# Patient Record
Sex: Female | Born: 1980 | Race: Black or African American | Hispanic: No | Smoking: Never smoker
Health system: Southern US, Community
[De-identification: ages and names within clinical notes are randomized; demographics above are authoritative.]

## PROBLEM LIST (undated history)

## (undated) DIAGNOSIS — N643 Galactorrhea not associated with childbirth: Secondary | ICD-10-CM

## (undated) DIAGNOSIS — E669 Obesity, unspecified: Secondary | ICD-10-CM

## (undated) DIAGNOSIS — E229 Hyperfunction of pituitary gland, unspecified: Secondary | ICD-10-CM

## (undated) DIAGNOSIS — D352 Benign neoplasm of pituitary gland: Secondary | ICD-10-CM

## (undated) HISTORY — DX: Obesity, unspecified: E66.9

## (undated) HISTORY — DX: Galactorrhea not associated with childbirth: N64.3

## (undated) HISTORY — DX: Benign neoplasm of pituitary gland: D35.2

## (undated) HISTORY — DX: Hyperfunction of pituitary gland, unspecified: E22.9

---

## 2014-06-14 DIAGNOSIS — E559 Vitamin D deficiency, unspecified: Secondary | ICD-10-CM | POA: Insufficient documentation

## 2015-04-21 DIAGNOSIS — D352 Benign neoplasm of pituitary gland: Secondary | ICD-10-CM

## 2015-04-21 HISTORY — DX: Benign neoplasm of pituitary gland: D35.2

## 2015-09-05 ENCOUNTER — Other Ambulatory Visit: Payer: Self-pay | Admitting: Nurse Practitioner

## 2015-10-15 DIAGNOSIS — E229 Hyperfunction of pituitary gland, unspecified: Secondary | ICD-10-CM

## 2015-10-15 DIAGNOSIS — D352 Benign neoplasm of pituitary gland: Secondary | ICD-10-CM | POA: Insufficient documentation

## 2017-01-21 LAB — HEPATIC FUNCTION PANEL
ALT: 17
AST: 19
Alkaline Phosphatase: 103 (ref 25–125)
Bilirubin, Total: 0.4

## 2017-01-21 LAB — TSH: TSH: 1.14 (ref 0.41–5.90)

## 2017-01-21 LAB — HEMOGLOBIN A1C: Hemoglobin A1C: 5.3

## 2017-01-21 LAB — LIPID PANEL
Cholesterol: 170 (ref 0–200)
HDL: 45 (ref 35–70)
LDL CALC: 114
LDl/HDL Ratio: 2.5
Triglycerides: 57 (ref 40–160)

## 2017-01-21 LAB — CBC AND DIFFERENTIAL
HCT: 40 (ref 39–52)
Hemoglobin: 13.3 (ref 13.0–17.0)
Neutrophils Absolute: 3
PLATELETS: 381 (ref 150–399)
WBC: 7.1

## 2017-01-21 LAB — VITAMIN D 25 HYDROXY (VIT D DEFICIENCY, FRACTURES): Vit D, 25-Hydroxy: 24.7

## 2017-01-21 LAB — BASIC METABOLIC PANEL
BUN: 7 (ref 4–21)
Creatinine: 0.9
GLUCOSE: 83
Potassium: 4.1 (ref 3.4–5.3)
SODIUM: 138 (ref 137–147)

## 2018-01-08 ENCOUNTER — Encounter: Payer: Self-pay | Admitting: Nurse Practitioner

## 2018-01-08 DIAGNOSIS — J309 Allergic rhinitis, unspecified: Secondary | ICD-10-CM | POA: Insufficient documentation

## 2018-01-08 DIAGNOSIS — R05 Cough: Secondary | ICD-10-CM | POA: Insufficient documentation

## 2018-01-08 DIAGNOSIS — R059 Cough, unspecified: Secondary | ICD-10-CM | POA: Insufficient documentation

## 2018-01-28 ENCOUNTER — Encounter: Payer: Self-pay | Admitting: Nurse Practitioner

## 2018-03-30 ENCOUNTER — Encounter: Payer: Self-pay | Admitting: Nurse Practitioner

## 2018-05-31 ENCOUNTER — Encounter: Payer: Self-pay | Admitting: Internal Medicine

## 2018-06-01 ENCOUNTER — Ambulatory Visit (INDEPENDENT_AMBULATORY_CARE_PROVIDER_SITE_OTHER): Admitting: Internal Medicine

## 2018-06-01 ENCOUNTER — Other Ambulatory Visit: Payer: Self-pay

## 2018-06-01 ENCOUNTER — Encounter: Payer: Self-pay | Admitting: Internal Medicine

## 2018-06-01 VITALS — BP 128/74 | HR 68 | Temp 98.8°F | Ht 62.8 in | Wt 230.2 lb

## 2018-06-01 DIAGNOSIS — Z0000 Encounter for general adult medical examination without abnormal findings: Secondary | ICD-10-CM | POA: Diagnosis not present

## 2018-06-01 DIAGNOSIS — D497 Neoplasm of unspecified behavior of endocrine glands and other parts of nervous system: Secondary | ICD-10-CM

## 2018-06-01 DIAGNOSIS — E661 Drug-induced obesity: Secondary | ICD-10-CM | POA: Diagnosis not present

## 2018-06-01 DIAGNOSIS — Z308 Encounter for other contraceptive management: Secondary | ICD-10-CM | POA: Diagnosis not present

## 2018-06-01 DIAGNOSIS — R635 Abnormal weight gain: Secondary | ICD-10-CM | POA: Diagnosis not present

## 2018-06-01 DIAGNOSIS — Z3041 Encounter for surveillance of contraceptive pills: Secondary | ICD-10-CM | POA: Diagnosis not present

## 2018-06-01 MED ORDER — NORGESTREL-ETHINYL ESTRADIOL 0.3-30 MG-MCG PO TABS: 1.0000 | ORAL_TABLET | Freq: Every day | ORAL | 3 refills | Status: DC

## 2018-06-01 MED ORDER — IBUPROFEN 800 MG PO TABS: 800.0000 mg | ORAL_TABLET | Freq: Three times a day (TID) | ORAL | 0 refills | Status: AC | PRN

## 2018-06-01 NOTE — Patient Instructions (Signed)
Please bring your immunization records so we can update them in your chart.   Preventive Care 18-39 Years, Female Preventive care refers to lifestyle choices and visits with your health care provider that can promote health and wellness. What does preventive care include?   A yearly physical exam. This is also called an annual well check.  Dental exams once or twice a year.  Routine eye exams. Ask your health care provider how often you should have your eyes checked.  Personal lifestyle choices, including: ? Daily care of your teeth and gums. ? Regular physical activity. ? Eating a healthy diet. ? Avoiding tobacco and drug use. ? Limiting alcohol use. ? Practicing safe sex. ? Taking vitamin and mineral supplements as recommended by your health care provider. What happens during an annual well check? The services and screenings done by your health care provider during your annual well check will depend on your age, overall health, lifestyle risk factors, and family history of disease. Counseling Your health care provider may ask you questions about your:  Alcohol use.  Tobacco use.  Drug use.  Emotional well-being.  Home and relationship well-being.  Sexual activity.  Eating habits.  Work and work Statistician.  Method of birth control.  Menstrual cycle.  Pregnancy history. Screening You may have the following tests or measurements:  Height, weight, and BMI.  Diabetes screening. This is done by checking your blood sugar (glucose) after you have not eaten for a while (fasting).  Blood pressure.  Lipid and cholesterol levels. These may be checked every 5 years starting at age 3.  Skin check.  Hepatitis C blood test.  Hepatitis B blood test.  Sexually transmitted disease (STD) testing.  BRCA-related cancer screening. This may be done if you have a family history of breast, ovarian, tubal, or peritoneal cancers.  Pelvic exam and Pap test. This may be  done every 3 years starting at age 94. Starting at age 18, this may be done every 5 years if you have a Pap test in combination with an HPV test. Discuss your test results, treatment options, and if necessary, the need for more tests with your health care provider. Vaccines Your health care provider may recommend certain vaccines, such as:  Influenza vaccine. This is recommended every year.  Tetanus, diphtheria, and acellular pertussis (Tdap, Td) vaccine. You may need a Td booster every 10 years.  Varicella vaccine. You may need this if you have not been vaccinated.  HPV vaccine. If you are 54 or younger, you may need three doses over 6 months.  Measles, mumps, and rubella (MMR) vaccine. You may need at least one dose of MMR. You may also need a second dose.  Pneumococcal 13-valent conjugate (PCV13) vaccine. You may need this if you have certain conditions and were not previously vaccinated.  Pneumococcal polysaccharide (PPSV23) vaccine. You may need one or two doses if you smoke cigarettes or if you have certain conditions.  Meningococcal vaccine. One dose is recommended if you are age 62-21 years and a first-year college student living in a residence hall, or if you have one of several medical conditions. You may also need additional booster doses.  Hepatitis A vaccine. You may need this if you have certain conditions or if you travel or work in places where you may be exposed to hepatitis A.  Hepatitis B vaccine. You may need this if you have certain conditions or if you travel or work in places where you may be exposed  to hepatitis B.  Haemophilus influenzae type b (Hib) vaccine. You may need this if you have certain risk factors. Talk to your health care provider about which screenings and vaccines you need and how often you need them. This information is not intended to replace advice given to you by your health care provider. Make sure you discuss any questions you have with your  health care provider. Document Released: 06/02/2001 Document Revised: 11/17/2016 Document Reviewed: 02/05/2015 Elsevier Interactive Patient Education  2019 [employer].

## 2018-06-01 NOTE — Progress Notes (Signed)
* Subjective:     Patient ID: Elizabeth Graham , adult    DOB: 14-Feb-1981 , 38 y.o.   MRN: [id number]   Chief Complaint  Patient presents with  . Annual Exam  . Referral    DR PATEL  - TUMOR    HPI  Pt is here for yearly physical. She also needs referral to Dr Posey Pronto for this year. Her last prolactin level last month was 12. She needs her Ibuprofen and birth control  Refilled. 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 She does admit she snacks the 7h she is in class. Her last pap was normal in 2018.   Past Medical History:  Diagnosis Date  . Obesity   . 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 Polygalactia         Current Outpatient Medications:  .  cabergoline (DOSTINEX) 0.5 MG tablet, Take 0.5 mg by mouth once a week., Disp: , Rfl:  .  cetirizine (ZYRTEC) 10 MG tablet, Take 10 mg by mouth daily., Disp: , Rfl:  .  norgestrel-ethinyl estradiol (LO/OVRAL,CRYSELLE) 0.3-30 MG-MCG tablet, Take 1 tablet by mouth daily., Disp: 3 Package, Rfl: 3 .  fluticasone (FLONASE) 50 MCG/ACT nasal spray, Place 2 sprays into both nostrils daily., Disp: , Rfl:  .  ibuprofen (ADVIL,MOTRIN) 800 MG tablet, Take 1 tablet (800 mg total) by mouth every 8 (eight) hours as needed for headache., Disp: 90 tablet, Rfl: 0  Family History  Problem Relation Age of Onset  . Diabetes Mother   . Hypertension Mother   . Heart disease Mother       Review of Systems  Constitutional: Positive for unexpected weight change. Negative for chills, diaphoresis and fatigue.       10 lb wt gain since last month.   HENT: Negative.   Eyes: Negative.   Respiratory: Negative for apnea, cough and shortness of breath.        Denies snoring  Cardiovascular: Positive for leg swelling. Negative for chest pain and palpitations.       Sometimes notices that her ankles get puffy.   Gastrointestinal: Negative.   Endocrine: Positive  for cold intolerance. Negative for heat intolerance, polydipsia and polyphagia.  Genitourinary: Positive for urgency. Negative for dysuria and frequency.  Musculoskeletal: Negative.   Skin: Negative.   Allergic/Immunologic: Negative for environmental allergies and food allergies.  Neurological: Positive for headaches. Negative for dizziness, tremors, seizures, syncope, weakness and numbness.       Chronic and Ibuprofen helps. Some weeks gets them more often and other weeks does not get them. Sometimes wakes up with them. No HA last month.   Hematological: Negative for adenopathy. Does not bruise/bleed easily.  Psychiatric/Behavioral: Negative for sleep disturbance. The patient is not nervous/anxious.        Denies depression     Today's Vitals   06/01/18 1147  BP: 128/74  Pulse: 68  Temp: 98.8 F (37.1 C)  TempSrc: Oral  SpO2: 98%  Weight: 230 lb 3.2 oz (104.4 kg)  Height: 5' 2.8" (1.595 m)   Body mass index is 41.04 kg/m.    Objective:  Physical Exam  BP 128/74 (BP Location: Left Arm, Patient Position: Sitting, Cuff Size: Normal)   Pulse 68   Temp 98.8 F (37.1 C) (Oral)   Ht 5' 2.8" (1.595 m)   Wt 230 lb 3.2 oz (104.4 kg)   LMP  04/23/2018 (Approximate)   SpO2 98%   BMI 41.04 kg/m   General Appearance:    Alert, obese, cooperative, no distress, appears stated age  Head:    Normocephalic, without obvious abnormality, atraumatic  Eyes:    PERRL, conjunctiva/corneas clear, EOM's intact, fundi    benign, both eyes  Ears:    Normal TM's and external ear canals, both ears  Nose:   Nares normal, septum midline, mucosa normal, no drainage    or sinus tenderness  Throat:   Lips, mucosa, and tongue normal; teeth and gums normal  Neck:   Supple, symmetrical, trachea midline, no adenopathy;    thyroid:  no enlargement/tenderness/nodules.  Back:     Symmetric, no curvature, ROM normal, no CVA tenderness  Lungs:     Clear to auscultation bilaterally, respirations unlabored   Chest Wall:    No tenderness or deformity   Heart:    Regular rate and rhythm, S1 and S2 normal, no murmur, rub   or gallop  Breast Exam:    No tenderness, masses, or nipple abnormality  Abdomen:     Soft, non-tender, bowel sounds active all four quadrants,    no masses, no organomegaly        Extremities:   Extremities normal, atraumatic, no cyanosis or edema  Pulses:   2+ and symmetric all extremities  Skin:   Skin color, texture, turgor normal, no rashes or lesions  Lymph nodes:   Cervical, supraclavicular, and axillary nodes normal  Neurologic:   CNII-XII intact, normal strength, sensation and reflexes    Throughout. Romberg, tandem gait, tip toe and heel gait are and finger to nose are normal.      Assessment And Plan:    1. Routine general medical examination at a health care facility-routine. FU 1y - POCT Urinalysis Dipstick (81002) - CMP14 + Anion Gap - Lipid Profile - CBC no Diff  2. Pituitary tumor- chronic. Referral done.  - Ambulatory referral to Endocrinology  3. Weight gain- new - TSH - T3, free - T4, Free  4. Class 3 severe obesity due to excess calories without serious comorbidity in adult, unspecified BMI ([place])- chronic. She was offered help to loose wt and she declined. I reviewed with her the health complications due to obesity.   5. Encounter for other contraceptive management- I refilled her birth control. FU 1y.       Waver Dibiasio RODRIGUEZ-SOUTHWORTH, PA-C

## 2018-06-02 LAB — POCT URINALYSIS DIPSTICK
BILIRUBIN UA: NEGATIVE
GLUCOSE UA: NEGATIVE
KETONES UA: NEGATIVE
Nitrite, UA: NEGATIVE
Protein, UA: NEGATIVE
UROBILINOGEN UA: 0.2 U/dL
pH, UA: 5.5 (ref 5.0–8.0)

## 2018-06-02 LAB — T4, FREE: FREE T4: 1.23 ng/dL

## 2018-06-02 LAB — CBC
HEMOGLOBIN: 12.6 g/dL
Hematocrit: 38.5 %
MCH: 29.4 pg
MCHC: 32.7 g/dL
MCV: 90 fL
Platelets: 334 10*3/uL (ref 150–450)
RBC: 4.28 x10E6/uL
RDW: 12.6 %
WBC: 8.3 10*3/uL (ref 3.4–10.8)

## 2018-06-02 LAB — CMP14 + ANION GAP
A/G RATIO: 1.4 (ref 1.2–2.2)
ALBUMIN: 4.1 g/dL
ALT: 16 IU/L
AST: 16 IU/L (ref 0–40)
Alkaline Phosphatase: 84 IU/L
Anion Gap: 15 mmol/L (ref 10.0–18.0)
BUN / CREAT RATIO: 9
BUN: 7 mg/dL
Bilirubin Total: 0.4 mg/dL (ref 0.0–1.2)
CALCIUM: 9 mg/dL
CO2: 22 mmol/L (ref 20–29)
Chloride: 102 mmol/L (ref 96–106)
Creatinine, Ser: 0.81 mg/dL
GLOBULIN, TOTAL: 2.9 g/dL (ref 1.5–4.5)
GLUCOSE: 79 mg/dL (ref 65–99)
POTASSIUM: 4.3 mmol/L (ref 3.5–5.2)
Sodium: 139 mmol/L (ref 134–144)
TOTAL PROTEIN: 7 g/dL (ref 6.0–8.5)

## 2018-06-02 LAB — LIPID PANEL
CHOL/HDL RATIO: 2.4 ratio
Cholesterol, Total: 137 mg/dL
HDL: 57 mg/dL (ref >39)
LDL Calculated: 66 mg/dL
Triglycerides: 68 mg/dL
VLDL Cholesterol Cal: 14 mg/dL (ref 5–40)

## 2018-06-02 LAB — TSH: TSH: 1.6 u[IU]/mL (ref 0.450–4.500)

## 2018-06-02 LAB — T3, FREE: T3 FREE: 2.6 pg/mL

## 2018-12-12 ENCOUNTER — Other Ambulatory Visit: Payer: Self-pay | Admitting: Nurse Practitioner

## 2018-12-12 MED ORDER — CETIRIZINE HCL 10 MG PO TABS: 10.0000 mg | ORAL_TABLET | Freq: Every day | ORAL | 0 refills | Status: DC

## 2019-01-26 ENCOUNTER — Other Ambulatory Visit: Payer: Self-pay | Admitting: Internal Medicine

## 2019-01-26 MED ORDER — NORGESTREL-ETHINYL ESTRADIOL 0.3-30 MG-MCG PO TABS: 1.0000 | ORAL_TABLET | Freq: Every day | ORAL | 0 refills | Status: DC

## 2019-01-26 NOTE — Progress Notes (Unsigned)
Please call pt and inform her that we do not have her last pap results in our records and I would like her to come sign a release for [place] to receive them, so I am able to continue prescribing her birth control. I need her to come in at least every 6 months for continuation of prescription, needs Fu for weight gain and BP check since she is on birth control

## 2019-01-30 NOTE — Progress Notes (Signed)
She has been getting her paps at this office, and she is also comfortable with coming once a year she has been coming once a year

## 2019-02-02 ENCOUNTER — Encounter: Payer: Self-pay | Admitting: Internal Medicine

## 2019-02-02 ENCOUNTER — Telehealth: Payer: Self-pay

## 2019-02-02 NOTE — Telephone Encounter (Signed)
OK 

## 2019-02-02 NOTE — Telephone Encounter (Signed)
Per Sunday Spillers: Her last pap was 01/2017 and is due this month for pap and physical. The only records I found in the old chart from here is 11/2017,  No prior visits with [facility]. Maybe she saw a provider that has left the practice. Please make her an apt, due before she rans out of her birth control pills.   Pt is scheduled for an OV w/pap appt Nov 2020 she's not due for a physical till Feb 2021

## 2019-02-02 NOTE — Progress Notes (Signed)
Her last pap was 01/2017 and is due this month for pap and physical. The only records I found in the old chart from here is 11/2017,  No prior visits with [facility]. Maybe she saw a provider that has left the practice. Please make her an apt, due before she rans out of her birth control pills.

## 2019-02-23 ENCOUNTER — Ambulatory Visit: Payer: Self-pay | Admitting: Internal Medicine

## 2019-03-29 ENCOUNTER — Telehealth: Payer: Self-pay

## 2019-03-29 NOTE — Telephone Encounter (Signed)
Patient called wanting to know when is her appt for her pap smear. I returned her call to confirm her appt. YRL,RMA

## 2019-06-06 ENCOUNTER — Encounter: Admitting: Internal Medicine

## 2019-06-08 ENCOUNTER — Encounter: Admitting: Internal Medicine

## 2019-07-11 ENCOUNTER — Other Ambulatory Visit (HOSPITAL_COMMUNITY)
Admission: RE | Admit: 2019-07-11 | Discharge: 2019-07-11 | Disposition: A | Discharge status: Home / Self Care | Source: Ambulatory Visit | Attending: Nurse Practitioner | Admitting: Nurse Practitioner

## 2019-07-11 ENCOUNTER — Other Ambulatory Visit: Payer: Self-pay

## 2019-07-11 ENCOUNTER — Ambulatory Visit (INDEPENDENT_AMBULATORY_CARE_PROVIDER_SITE_OTHER): Admitting: Nurse Practitioner

## 2019-07-11 VITALS — BP 114/76 | HR 77 | Temp 98.4°F | Ht 61.0 in | Wt 234.0 lb

## 2019-07-11 DIAGNOSIS — D352 Benign neoplasm of pituitary gland: Secondary | ICD-10-CM

## 2019-07-11 DIAGNOSIS — Z113 Encounter for screening for infections with a predominantly sexual mode of transmission: Secondary | ICD-10-CM

## 2019-07-11 DIAGNOSIS — Z124 Encounter for screening for malignant neoplasm of cervix: Secondary | ICD-10-CM | POA: Insufficient documentation

## 2019-07-11 DIAGNOSIS — Z0000 Encounter for general adult medical examination without abnormal findings: Secondary | ICD-10-CM | POA: Diagnosis not present

## 2019-07-11 DIAGNOSIS — Z308 Encounter for other contraceptive management: Secondary | ICD-10-CM

## 2019-07-11 DIAGNOSIS — E229 Hyperfunction of pituitary gland, unspecified: Secondary | ICD-10-CM

## 2019-07-11 LAB — POCT URINALYSIS DIPSTICK
Bilirubin, UA: NEGATIVE
Blood, UA: NEGATIVE
Glucose, UA: NEGATIVE
Ketones, UA: NEGATIVE
Nitrite, UA: NEGATIVE
Protein, UA: POSITIVE — AB
Spec Grav, UA: >=1.03 — AB (ref 1.010–1.025)
Urobilinogen, UA: 0.2 E.U./dL
pH, UA: 5.5 (ref 5.0–8.0)

## 2019-07-11 MED ORDER — NORGESTREL-ETHINYL ESTRADIOL 0.3-30 MG-MCG PO TABS: 1.0000 | ORAL_TABLET | Freq: Every day | ORAL | 3 refills | Status: DC

## 2019-07-11 NOTE — Progress Notes (Signed)
This visit occurred during the SARS-CoV-2 public health emergency.  Safety protocols were in place, including screening questions prior to the visit, additional usage of staff PPE, and extensive cleaning of exam room while observing appropriate contact time as indicated for disinfecting solutions.  Subjective:     Patient ID: Elizabeth Graham , adult    DOB: 30-Jul-1980 , 39 y.o.   MRN: [id number]   Chief Complaint  Patient presents with  . Annual Exam    HPI  Here for HM   She needs a referral to Dr. Posey Pronto renewed.   The patient states she uses OCP (estrogen/progesterone) for birth control. . Negative for Dysmenorrhea and Negative for Menorrhagia Mammogram not done yet will order next year, she does do breast self exams.  Negative for: breast discharge, breast lump(s), breast pain and breast self exam.  Pertinent negatives include abnormal bleeding (hematology), anxiety, decreased libido, depression, difficulty falling sleep, dyspareunia, history of infertility, nocturia, sexual dysfunction, sleep disturbances, urinary incontinence, urinary urgency, vaginal discharge and vaginal itching. Diet regular. The patient states her exercise level is moderate - 2 times a week riding bikes.      The patient's tobacco use is:  Social History   Tobacco Use  Smoking Status Never Smoker  Smokeless Tobacco Never Used   She has been exposed to passive smoke. The patient's alcohol use is:  Social History   Substance and Sexual Activity  Alcohol Use Yes   Comment: SOCIALLY   Additional information: Last pap 2018, next one scheduled for today Past Medical History:  Diagnosis Date  . Obesity   . 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 Polygalactia      Family History  Problem Relation Age of Onset  . Diabetes Mother   . Hypertension Mother   . Heart disease Mother      Current Outpatient Medications:  .  cabergoline (DOSTINEX) 0.5 MG tablet, Take 0.5 mg by mouth once  a week., Disp: , Rfl:  .  cetirizine (ZYRTEC) 10 MG tablet, Take 1 tablet (10 mg total) by mouth daily., Disp: 90 tablet, Rfl: 0 .  fluticasone (FLONASE) 50 MCG/ACT nasal spray, Place 2 sprays into both nostrils daily., Disp: , Rfl:  .  ibuprofen (ADVIL,MOTRIN) 800 MG tablet, Take 1 tablet (800 mg total) by mouth every 8 (eight) hours as needed for headache., Disp: 90 tablet, Rfl: 0 .  norgestrel-ethinyl estradiol (LO/OVRAL) 0.3-30 MG-MCG tablet, Take 1 tablet by mouth daily. Pt needs office visit this month, Disp: 3 Package, Rfl: 0   No Known Allergies   Review of Systems  Constitutional: Negative.   HENT: Negative.   Eyes: Negative.   Respiratory: Negative.   Cardiovascular: Negative.   Gastrointestinal: Negative.   Endocrine: Negative.   Genitourinary: Negative.   Musculoskeletal: Negative.   Skin: Negative.   Allergic/Immunologic: Negative.   Neurological: Negative.   Hematological: Negative.   Psychiatric/Behavioral: Negative.      Today's Vitals   07/11/19 1039  BP: 114/76  Pulse: 77  Temp: 98.4 F (36.9 C)  TempSrc: Oral  Weight: 234 lb (106.1 kg)  Height: _0  (1.549 m)   Body mass index is 44.21 kg/m.   Objective:  Physical Exam Vitals reviewed.  Constitutional:      Appearance: Normal appearance. She is well-developed.  HENT:     Head: Normocephalic and atraumatic.     Right Ear: Hearing, tympanic membrane, ear canal and external ear normal. There is no impacted cerumen.  Left Ear: Hearing, tympanic membrane, ear canal and external ear normal. There is no impacted cerumen.     Nose: Nose normal.     Mouth/Throat:     Mouth: Mucous membranes are moist.  Eyes:     General: Lids are normal.     Conjunctiva/sclera: Conjunctivae normal.     Pupils: Pupils are equal, round, and reactive to light.     Funduscopic exam:    Right eye: No papilledema.        Left eye: No papilledema.  Neck:     Thyroid: No thyroid mass.     Vascular: No carotid bruit.   Cardiovascular:     Rate and Rhythm: Normal rate and regular rhythm.     Pulses: Normal pulses.     Heart sounds: Normal heart sounds. No murmur.  Pulmonary:     Effort: Pulmonary effort is normal.     Breath sounds: Normal breath sounds.  Chest:     Breasts:        Right: Normal. No mass or tenderness.        Left: Normal. No mass or tenderness.  Abdominal:     General: Abdomen is flat. Bowel sounds are normal.     Palpations: Abdomen is soft.  Genitourinary:    General: Normal vulva.     Vagina: Normal.     Cervix: Normal.     Uterus: Normal.      Adnexa: Right adnexa normal and left adnexa normal.       Right: No mass or tenderness.         Left: No mass or tenderness.    Musculoskeletal:        General: No swelling. Normal range of motion.     Cervical back: Full passive range of motion without pain, normal range of motion and neck supple.     Right lower leg: No edema.     Left lower leg: No edema.  Lymphadenopathy:     Upper Body:     Right upper body: No supraclavicular or axillary adenopathy.     Left upper body: No supraclavicular or axillary adenopathy.  Skin:    General: Skin is warm and dry.     Capillary Refill: Capillary refill takes less than 2 seconds.  Neurological:     General: No focal deficit present.     Mental Status: She is alert and oriented to person, place, and time.     Cranial Nerves: No cranial nerve deficit.     Sensory: No sensory deficit.  Psychiatric:        Mood and Affect: Mood normal.        Behavior: Behavior normal.        Thought Content: Thought content normal.        Judgment: Judgment normal.         Assessment And Plan:     1. Routine general medical examination at a health care facility . Behavior modifications discussed and diet history reviewed.   . Pt will continue to exercise regularly and modify diet with low GI, plant based foods and decrease intake of processed foods.  . Recommend intake of daily multivitamin,  Vitamin D, and calcium.  . Recommend mammogram (will order next year) for preventive screenings, as well as recommend immunizations that include influenza, TDAP - POCT Urinalysis Dipstick (81002) - CMP14+EGFR - CBC - Lipid panel  2. Encounter for other contraceptive management  Denies headaches or leg pain  Refill of  birth control given - norgestrel-ethinyl estradiol (LO/OVRAL) 0.3-30 MG-MCG tablet; Take 1 tablet by mouth daily. Pt needs office visit this month  Dispense: 3 Package; Refill: 3  3. Encounter for Papanicolaou smear of cervix  No abnormal findings on physical exam - Cytology -Pap Smear  4. Screen for STD (sexually transmitted disease)  - Cervicovaginal ancillary only - HIV antibody (with reflex) - T pallidum Screening [place]  5. Pituitary tumor  Being followed at [facility]   Would like her thyroid levels checked. - TSH - T4 - T3, free - Prolactin  Minette Brine, FNP    THE PATIENT IS ENCOURAGED TO PRACTICE SOCIAL DISTANCING DUE TO THE COVID-19 PANDEMIC.

## 2019-07-11 NOTE — Patient Instructions (Signed)
Health Maintenance, Female Adopting a healthy lifestyle and getting preventive care are important in promoting health and wellness. Ask your health care provider about:  The right schedule for you to have regular tests and exams.  Things you can do on your own to prevent diseases and keep yourself healthy. What should I know about diet, weight, and exercise? Eat a healthy diet   Eat a diet that includes plenty of vegetables, fruits, low-fat dairy products, and lean protein.  Do not eat a lot of foods that are high in solid fats, added sugars, or sodium. Maintain a healthy weight Body mass index (BMI) is used to identify weight problems. It estimates body fat based on height and weight. Your health care provider can help determine your BMI and help you achieve or maintain a healthy weight. Get regular exercise Get regular exercise. This is one of the most important things you can do for your health. Most adults should:  Exercise for at least 150 minutes each week. The exercise should increase your heart rate and make you sweat (moderate-intensity exercise).  Do strengthening exercises at least twice a week. This is in addition to the moderate-intensity exercise.  Spend less time sitting. Even light physical activity can be beneficial. Watch cholesterol and blood lipids Have your blood tested for lipids and cholesterol at 39 years of age, then have this test every 5 years. Have your cholesterol levels checked more often if:  Your lipid or cholesterol levels are high.  You are older than 40 years of age.  You are at high risk for heart disease. What should I know about cancer screening? Depending on your health history and family history, you may need to have cancer screening at various ages. This may include screening for:  Breast cancer.  Cervical cancer.  Colorectal cancer.  Skin cancer.  Lung cancer. What should I know about heart disease, diabetes, and high blood  pressure? Blood pressure and heart disease  High blood pressure causes heart disease and increases the risk of stroke. This is more likely to develop in people who have high blood pressure readings, are of African descent, or are overweight.  Have your blood pressure checked: ? Every 3-5 years if you are 18-39 years of age. ? Every year if you are 40 years old or older. Diabetes Have regular diabetes screenings. This checks your fasting blood sugar level. Have the screening done:  Once every three years after age 40 if you are at a normal weight and have a low risk for diabetes.  More often and at a younger age if you are overweight or have a high risk for diabetes. What should I know about preventing infection? Hepatitis B If you have a higher risk for hepatitis B, you should be screened for this virus. Talk with your health care provider to find out if you are at risk for hepatitis B infection. Hepatitis C Testing is recommended for:  Everyone born from 1945 through 1965.  Anyone with known risk factors for hepatitis C. Sexually transmitted infections (STIs)  Get screened for STIs, including gonorrhea and chlamydia, if: ? You are sexually active and are younger than 39 years of age. ? You are older than 39 years of age and your health care provider tells you that you are at risk for this type of infection. ? Your sexual activity has changed since you were last screened, and you are at increased risk for chlamydia or gonorrhea. Ask your health care provider if   you are at risk.  Ask your health care provider about whether you are at high risk for HIV. Your health care provider may recommend a prescription medicine to help prevent HIV infection. If you choose to take medicine to prevent HIV, you should first get tested for HIV. You should then be tested every 3 months for as long as you are taking the medicine. Pregnancy  If you are about to stop having your period (premenopausal) and  you may become pregnant, seek counseling before you get pregnant.  Take 400 to 800 micrograms (mcg) of folic acid every day if you become pregnant.  Ask for birth control (contraception) if you want to prevent pregnancy. Osteoporosis and menopause Osteoporosis is a disease in which the bones lose minerals and strength with aging. This can result in bone fractures. If you are 65 years old or older, or if you are at risk for osteoporosis and fractures, ask your health care provider if you should:  Be screened for bone loss.  Take a calcium or vitamin D supplement to lower your risk of fractures.  Be given hormone replacement therapy (HRT) to treat symptoms of menopause. Follow these instructions at home: Lifestyle  Do not use any products that contain nicotine or tobacco, such as cigarettes, e-cigarettes, and chewing tobacco. If you need help quitting, ask your health care provider.  Do not use street drugs.  Do not share needles.  Ask your health care provider for help if you need support or information about quitting drugs. Alcohol use  Do not drink alcohol if: ? Your health care provider tells you not to drink. ? You are pregnant, may be pregnant, or are planning to become pregnant.  If you drink alcohol: ? Limit how much you use to 0-1 drink a day. ? Limit intake if you are breastfeeding.  Be aware of how much alcohol is in your drink. In the [place] one drink equals one 12 oz bottle of beer (355 mL), one 5 oz glass of wine (148 mL), or one 1 oz glass of hard liquor (44 mL). General instructions  Schedule regular health, dental, and eye exams.  Stay current with your vaccines.  Tell your health care provider if: ? You often feel depressed. ? You have ever been abused or do not feel safe at home. Summary  Adopting a healthy lifestyle and getting preventive care are important in promoting health and wellness.  Follow your health care provider's instructions about healthy  diet, exercising, and getting tested or screened for diseases.  Follow your health care provider's instructions on monitoring your cholesterol and blood pressure. This information is not intended to replace advice given to you by your health care provider. Make sure you discuss any questions you have with your health care provider. Document Revised: 03/30/2018 Document Reviewed: 03/30/2018 Elsevier Patient Education  2020 [employer]  

## 2019-07-12 LAB — CBC
Hematocrit: 40.5 %
Hemoglobin: 13.3 g/dL
MCH: 30.4 pg
MCHC: 32.8 g/dL
MCV: 93 fL
Platelets: 357 10*3/uL (ref 150–450)
RBC: 4.38 x10E6/uL
RDW: 12.5 %
WBC: 6.9 10*3/uL (ref 3.4–10.8)

## 2019-07-12 LAB — HIV ANTIBODY (ROUTINE TESTING W REFLEX): HIV Screen 4th Generation wRfx: NONREACTIVE

## 2019-07-12 LAB — T PALLIDUM SCREENING CASCADE: T pallidum Antibodies (TP-PA): NONREACTIVE

## 2019-07-12 LAB — CMP14+EGFR
ALT: 10 IU/L
AST: 13 IU/L (ref 0–40)
Albumin/Globulin Ratio: 1.5 (ref 1.2–2.2)
Albumin: 4.2 g/dL
Alkaline Phosphatase: 85 IU/L
BUN/Creatinine Ratio: 8
BUN: 7 mg/dL
Bilirubin Total: 0.3 mg/dL (ref 0.0–1.2)
CO2: 22 mmol/L (ref 20–29)
Calcium: 9.3 mg/dL
Chloride: 102 mmol/L (ref 96–106)
Creatinine, Ser: 0.86 mg/dL
Globulin, Total: 2.8 g/dL (ref 1.5–4.5)
Glucose: 83 mg/dL (ref 65–99)
Potassium: 4.1 mmol/L (ref 3.5–5.2)
Sodium: 138 mmol/L (ref 134–144)
Total Protein: 7 g/dL (ref 6.0–8.5)

## 2019-07-12 LAB — LIPID PANEL
Chol/HDL Ratio: 3.2 ratio
Cholesterol, Total: 147 mg/dL
HDL: 46 mg/dL (ref >39)
LDL Chol Calc (NIH): 86 mg/dL
Triglycerides: 74 mg/dL
VLDL Cholesterol Cal: 15 mg/dL (ref 5–40)

## 2019-07-12 LAB — PROLACTIN: Prolactin: 18.6 ng/mL

## 2019-07-12 LAB — TSH: TSH: 1.24 u[IU]/mL (ref 0.450–4.500)

## 2019-07-12 LAB — T4: T4, Total: 12.1 ug/dL — ABNORMAL HIGH (ref 4.5–12.0)

## 2019-07-12 LAB — T3, FREE: T3, Free: 3.1 pg/mL

## 2019-07-13 LAB — CERVICOVAGINAL ANCILLARY ONLY
Bacterial Vaginitis (gardnerella): NEGATIVE
Candida Glabrata: NEGATIVE
Candida Vaginitis: NEGATIVE
Chlamydia: NEGATIVE
Comment: NEGATIVE
Comment: NEGATIVE
Comment: NEGATIVE
Comment: NEGATIVE
Comment: NEGATIVE
Comment: NORMAL
Neisseria Gonorrhea: NEGATIVE
Trichomonas: NEGATIVE

## 2019-07-13 LAB — CYTOLOGY - PAP: Diagnosis: NEGATIVE

## 2019-07-18 ENCOUNTER — Encounter: Payer: Self-pay | Admitting: Nurse Practitioner

## 2019-08-14 ENCOUNTER — Encounter: Payer: Self-pay | Admitting: Nurse Practitioner

## 2019-08-15 ENCOUNTER — Other Ambulatory Visit: Payer: Self-pay | Admitting: Nurse Practitioner

## 2019-08-15 ENCOUNTER — Telehealth: Payer: Self-pay

## 2019-08-15 DIAGNOSIS — D497 Neoplasm of unspecified behavior of endocrine glands and other parts of nervous system: Secondary | ICD-10-CM

## 2019-08-15 NOTE — Telephone Encounter (Signed)
I called patient and advised her that we have updated her referral to Dr.Patel's office. YL,RMA

## 2019-08-29 ENCOUNTER — Other Ambulatory Visit

## 2019-08-29 ENCOUNTER — Other Ambulatory Visit: Payer: Self-pay

## 2019-08-29 ENCOUNTER — Other Ambulatory Visit: Payer: Self-pay | Admitting: Nurse Practitioner

## 2019-08-29 DIAGNOSIS — R946 Abnormal results of thyroid function studies: Secondary | ICD-10-CM

## 2019-08-30 LAB — T3, FREE: T3, Free: 3.2 pg/mL (ref 2.0–4.4)

## 2019-08-30 LAB — T4: T4, Total: 11.9 ug/dL (ref 4.5–12.0)

## 2019-08-30 LAB — TSH: TSH: 0.838 u[IU]/mL (ref 0.450–4.500)

## 2019-12-09 ENCOUNTER — Other Ambulatory Visit: Payer: Self-pay | Admitting: Nurse Practitioner

## 2020-06-13 ENCOUNTER — Ambulatory Visit (INDEPENDENT_AMBULATORY_CARE_PROVIDER_SITE_OTHER): Admitting: Nurse Practitioner

## 2020-06-13 ENCOUNTER — Other Ambulatory Visit: Payer: Self-pay

## 2020-06-13 ENCOUNTER — Encounter: Payer: Self-pay | Admitting: Nurse Practitioner

## 2020-06-13 ENCOUNTER — Ambulatory Visit
Admission: RE | Admit: 2020-06-13 | Discharge: 2020-06-13 | Disposition: A | Discharge status: Home / Self Care | Source: Ambulatory Visit | Attending: Nurse Practitioner | Admitting: Nurse Practitioner

## 2020-06-13 VITALS — BP 130/80 | HR 66 | Temp 98.2°F | Ht 62.0 in | Wt 220.0 lb

## 2020-06-13 DIAGNOSIS — Z6841 Body Mass Index (BMI) 40.0 and over, adult: Secondary | ICD-10-CM

## 2020-06-13 DIAGNOSIS — R079 Chest pain, unspecified: Secondary | ICD-10-CM

## 2020-06-13 DIAGNOSIS — R9431 Abnormal electrocardiogram [ECG] [EKG]: Secondary | ICD-10-CM

## 2020-06-13 NOTE — Patient Instructions (Signed)
Aspirin 81 mg daily  Making a referral to cardiology

## 2020-06-13 NOTE — Progress Notes (Signed)
I,Yamilka Roman [employer] as a Education administrator for [employer], FNP.,have documented all relevant documentation on the behalf of Minette Brine, FNP,as directed by  Minette Brine, FNP while in the presence of Minette Brine, [place]. This visit occurred during the SARS-CoV-2 public health emergency.  Safety protocols were in place, including screening questions prior to the visit, additional usage of staff PPE, and extensive cleaning of exam room while observing appropriate contact time as indicated for disinfecting solutions.  Subjective:     Patient ID: Elizabeth Graham , female    DOB: 1980/11/10 , 40 y.o.   MRN: [id number]   Chief Complaint  Patient presents with  . Chest Pain    Patient stated her chest pain about 2 days ago  . Weight Check    HPI  She began having chest pain at 12 am Wednesday morning which progressively worsened.   Chest Pain  This is a new problem. The current episode started in the past 7 days. The onset quality is sudden. The quality of the pain is described as sharp. Pertinent negatives include no abdominal pain, back pain, cough, diaphoresis, dizziness, exertional chest pressure, headaches, nausea, palpitations, shortness of breath, syncope, vomiting or weakness. She has tried NSAIDs (she has been taking ibuprofen since 12 o'clock off and on) for the symptoms. Risk factors include obesity, sedentary lifestyle and lack of exercise.  Pertinent negatives for past medical history include no aneurysm.  Her family medical history is significant for diabetes and hypertension.     Past Medical History:  Diagnosis Date  . Obesity   . 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 Polygalactia      Family History  Problem Relation Age of Onset  . Diabetes Mother   . Hypertension Mother   . Heart disease Mother      Current Outpatient Medications:  .  cetirizine (ZYRTEC) 10 MG tablet, TAKE 1 TABLET DAILY, Disp: 90 tablet, Rfl: 0 .  fluticasone (FLONASE) 50  MCG/ACT nasal spray, Place 2 sprays into both nostrils daily., Disp: , Rfl:  .  ibuprofen (ADVIL,MOTRIN) 800 MG tablet, Take 1 tablet (800 mg total) by mouth every 8 (eight) hours as needed for headache., Disp: 90 tablet, Rfl: 0 .  norgestrel-ethinyl estradiol (LO/OVRAL) 0.3-30 MG-MCG tablet, Take 1 tablet by mouth daily. Pt needs office visit this month, Disp: 3 Package, Rfl: 3   No Known Allergies   Review of Systems  Constitutional: Negative for diaphoresis.  Respiratory: Negative for cough and shortness of breath.   Cardiovascular: Positive for chest pain. Negative for palpitations, leg swelling and syncope.  Gastrointestinal: Negative for abdominal pain, nausea and vomiting.  Musculoskeletal: Negative for back pain.  Neurological: Negative for dizziness, weakness and headaches.     Today's Vitals   06/13/20 0849  BP: 130/80  Pulse: 66  Temp: 98.2 F (36.8 C)  TempSrc: Oral  Weight: 220 lb (99.8 kg)  Height: '5\' 2"'  (1.575 m)  PainSc: 9    Body mass index is 40.24 kg/m.   Objective:  Physical Exam Vitals reviewed.  Constitutional:      General: She is not in acute distress.    Appearance: She is well-developed. She is obese.  Neck:     Vascular: No JVD.  Cardiovascular:     Rate and Rhythm: Normal rate and regular rhythm.     Comments: Negative bruit Pulmonary:     Effort: Pulmonary effort is normal.     Breath sounds: Normal breath sounds.  Skin:  General: Skin is warm.     Capillary Refill: Capillary refill takes less than 2 seconds.  Neurological:     Mental Status: She is alert.         Assessment And Plan:     1. Chest pain, unspecified type She has been having progressive chest pain  Bundle branch block on her EKG - EKG 12-Lead - CMP14+EGFR - CBC - DG Chest 2 View; Future - Ambulatory referral to Cardiology - Hepatitis C antibody  2. Class 3 severe obesity due to excess calories without serious comorbidity with body mass index (BMI) of 40.0 to  44.9 in adult [facility])  Chronic  Discussed healthy diet and regular exercise options   Encouraged to exercise at least 150 minutes per week with 2 days of strength training  We will discuss more in detail any options for weight loss at her physical  - Insulin, random - TSH  3. Abnormal EKG  Bundle branch block noted on EKG, I do not have an EKG to compare - Ambulatory referral to Cardiology     Patient was given opportunity to ask questions. Patient verbalized understanding of the plan and was able to repeat key elements of the plan. All questions were answered to their satisfaction.  Minette Brine, FNP   I, Minette Brine, FNP, have reviewed all documentation for this visit. The documentation on 06/15/20 for the exam, diagnosis, procedures, and orders are all accurate and complete.   THE PATIENT IS ENCOURAGED TO PRACTICE SOCIAL DISTANCING DUE TO THE COVID-19 PANDEMIC.

## 2020-06-14 LAB — CBC
Hematocrit: 39.8 % (ref 34.0–46.6)
Hemoglobin: 12.9 g/dL (ref 11.1–15.9)
MCH: 29.4 pg (ref 26.6–33.0)
MCHC: 32.4 g/dL (ref 31.5–35.7)
MCV: 91 fL (ref 79–97)
Platelets: 387 10*3/uL (ref 150–450)
RBC: 4.39 x10E6/uL (ref 3.77–5.28)
RDW: 13 % (ref 11.7–15.4)
WBC: 5.9 10*3/uL (ref 3.4–10.8)

## 2020-06-14 LAB — CMP14+EGFR
ALT: 15 IU/L (ref 0–32)
AST: 14 IU/L (ref 0–40)
Albumin/Globulin Ratio: 1.5 (ref 1.2–2.2)
Albumin: 4.3 g/dL (ref 3.8–4.8)
Alkaline Phosphatase: 78 IU/L (ref 44–121)
BUN/Creatinine Ratio: 6 — ABNORMAL LOW (ref 9–23)
BUN: 6 mg/dL (ref 6–24)
Bilirubin Total: 0.3 mg/dL (ref 0.0–1.2)
CO2: 22 mmol/L (ref 20–29)
Calcium: 9.4 mg/dL (ref 8.7–10.2)
Chloride: 103 mmol/L (ref 96–106)
Creatinine, Ser: 0.95 mg/dL (ref 0.57–1.00)
GFR calc Af Amer: 87 mL/min/{1.73_m2} (ref >59)
GFR calc non Af Amer: 75 mL/min/{1.73_m2} (ref >59)
Globulin, Total: 2.8 g/dL (ref 1.5–4.5)
Glucose: 97 mg/dL (ref 65–99)
Potassium: 4.5 mmol/L (ref 3.5–5.2)
Sodium: 140 mmol/L (ref 134–144)
Total Protein: 7.1 g/dL (ref 6.0–8.5)

## 2020-06-14 LAB — HEPATITIS C ANTIBODY: Hep C Virus Ab: <0.1 s/co ratio (ref 0.0–0.9)

## 2020-06-14 LAB — INSULIN, RANDOM: INSULIN: 20.9 u[IU]/mL (ref 2.6–24.9)

## 2020-06-14 LAB — TSH: TSH: 1.51 u[IU]/mL (ref 0.450–4.500)

## 2020-07-09 ENCOUNTER — Encounter: Payer: Self-pay | Admitting: Nurse Practitioner

## 2020-07-15 ENCOUNTER — Encounter: Payer: Self-pay | Admitting: Nurse Practitioner

## 2020-07-15 ENCOUNTER — Other Ambulatory Visit: Payer: Self-pay

## 2020-07-15 ENCOUNTER — Ambulatory Visit: Admitting: Nurse Practitioner

## 2020-07-15 VITALS — BP 112/80 | HR 85 | Temp 98.2°F | Ht 62.4 in | Wt 221.2 lb

## 2020-07-15 DIAGNOSIS — Z0000 Encounter for general adult medical examination without abnormal findings: Secondary | ICD-10-CM | POA: Diagnosis not present

## 2020-07-15 DIAGNOSIS — E669 Obesity, unspecified: Secondary | ICD-10-CM

## 2020-07-15 DIAGNOSIS — Z6839 Body mass index (BMI) 39.0-39.9, adult: Secondary | ICD-10-CM

## 2020-07-15 MED ORDER — PHENTERMINE HCL 15 MG PO CAPS: 15.0000 mg | ORAL_CAPSULE | ORAL | 1 refills | Status: DC

## 2020-07-15 MED ORDER — CETIRIZINE HCL 10 MG PO TABS: 10.0000 mg | ORAL_TABLET | Freq: Every day | ORAL | 1 refills | Status: DC

## 2020-07-15 NOTE — Progress Notes (Signed)
This visit occurred during the SARS-CoV-2 public health emergency.  Safety protocols were in place, including screening questions prior to the visit, additional usage of staff PPE, and extensive cleaning of exam room while observing appropriate contact time as indicated for disinfecting solutions.  Subjective:     Patient ID: Elizabeth Graham , female    DOB: 1980-12-29 , 40 y.o.   MRN: [id number]   Chief Complaint  Patient presents with  . Annual Exam  . Weight Check    HPI  Here for HM.  She will have her PAPs done here at the office. She has not tried any medications for weight loss.  She is weighing herself once a week.  Denies being under any increased amount of stress. She eats a salad for lunch using olive oil, dinner will have rice with mixed vegetables. She will eat sugar free/fat free yogurt.   Wt Readings from Last 3 Encounters: 07/15/20 : 221 lb 3.2 oz (100.3 kg) 06/13/20 : 220 lb (99.8 kg) 07/11/19 : 234 lb (106.1 kg)     Past Medical History:  Diagnosis Date  . Obesity   . 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 Polygalactia      Family History  Problem Relation Age of Onset  . Diabetes Mother   . Hypertension Mother   . Heart disease Mother      Current Outpatient Medications:  .  fluticasone (FLONASE) 50 MCG/ACT nasal spray, Place 2 sprays into both nostrils daily., Disp: , Rfl:  .  ibuprofen (ADVIL,MOTRIN) 800 MG tablet, Take 1 tablet (800 mg total) by mouth every 8 (eight) hours as needed for headache., Disp: 90 tablet, Rfl: 0 .  norgestrel-ethinyl estradiol (LO/OVRAL) 0.3-30 MG-MCG tablet, Take 1 tablet by mouth daily. Pt needs office visit this month, Disp: 3 Package, Rfl: 3 .  phentermine 15 MG capsule, Take 1 capsule (15 mg total) by mouth every morning., Disp: 30 capsule, Rfl: 1 .  cetirizine (ZYRTEC) 10 MG tablet, Take 1 tablet (10 mg total) by mouth daily., Disp: 90 tablet, Rfl: 1 .  metoprolol tartrate (LOPRESSOR) 50 MG tablet,  Take 1 tablet (50 mg total) by mouth once for 1 dose. PLEASE TAKE METOPROLOL 2  HOURS PRIOR TO CTA SCAN., Disp: 1 tablet, Rfl: 0   No Known Allergies    The patient states she uses OCP (estrogen/progesterone) for birth control.  Patient's last menstrual period was 07/12/2020 (exact date).. Negative for Dysmenorrhea and Negative for Menorrhagia. Negative for: breast discharge, breast lump(s), breast pain and breast self exam. Associated symptoms include abnormal vaginal bleeding. Pertinent negatives include abnormal bleeding (hematology), anxiety, decreased libido, depression, difficulty falling sleep, dyspareunia, history of infertility, nocturia, sexual dysfunction, sleep disturbances, urinary incontinence, urinary urgency, vaginal discharge and vaginal itching. Diet regular; she has been eating fruit and prepping her meals since February 28th. She has been exercising doing treadmill for one hour and bike for 30 minutes. She is not eating sweets.  She is drinking approximately 90 oz.  she is not drinking any sugary drinks. She is frustrated due to not losing any physical weight. She is a shift Insurance underwriter.    The patient's tobacco use is:  Social History   Tobacco Use  Smoking Status Never Smoker  Smokeless Tobacco Never Used   She has been exposed to passive smoke. The patient's alcohol use is:  Social History   Substance and Sexual Activity  Alcohol Use Yes   Comment: SOCIALLY   Additional information: Last pap 2021,  next one scheduled for 2024.    Review of Systems  Constitutional: Negative.   HENT: Negative.   Eyes: Negative.   Respiratory: Negative.   Cardiovascular: Negative.   Gastrointestinal: Negative.   Endocrine: Negative.   Genitourinary: Negative.   Musculoskeletal: Negative.   Skin: Negative.   Allergic/Immunologic: Negative.   Neurological: Negative.   Hematological: Negative.   Psychiatric/Behavioral: Negative.      Today's Vitals   07/15/20 1012  BP: 112/80   Pulse: 85  Temp: 98.2 F (36.8 C)  TempSrc: Oral  Weight: 221 lb 3.2 oz (100.3 kg)  Height: 5' 2.4" (1.585 m)  PainSc: 0-No pain   Body mass index is 39.94 kg/m.   Objective:  Physical Exam Constitutional:      General: She is not in acute distress.    Appearance: Normal appearance. She is well-developed. She is obese.  HENT:     Head: Normocephalic and atraumatic.     Right Ear: Hearing, tympanic membrane, ear canal and external ear normal. There is no impacted cerumen.     Left Ear: Hearing, tympanic membrane, ear canal and external ear normal. There is no impacted cerumen.     Nose:     Comments: Deferred - masked    Mouth/Throat:     Comments: Deferred - masked Eyes:     General: Lids are normal.     Extraocular Movements: Extraocular movements intact.     Conjunctiva/sclera: Conjunctivae normal.     Pupils: Pupils are equal, round, and reactive to light.     Funduscopic exam:    Right eye: No papilledema.        Left eye: No papilledema.  Neck:     Thyroid: No thyroid mass.     Vascular: No carotid bruit.  Cardiovascular:     Rate and Rhythm: Normal rate and regular rhythm.     Pulses: Normal pulses.     Heart sounds: Normal heart sounds. No murmur heard.   Pulmonary:     Effort: Pulmonary effort is normal. No respiratory distress.     Breath sounds: Normal breath sounds. No wheezing.  Chest:     Chest wall: No mass.  Breasts:     Tanner Score is 5.     Right: Normal. No mass, tenderness, axillary adenopathy or supraclavicular adenopathy.     Left: Normal. No mass, tenderness, axillary adenopathy or supraclavicular adenopathy.    Abdominal:     General: Abdomen is flat. Bowel sounds are normal. There is no distension.     Palpations: Abdomen is soft.     Tenderness: There is no abdominal tenderness.  Genitourinary:    Rectum: Guaiac result negative.  Musculoskeletal:        General: No swelling. Normal range of motion.     Cervical back: Full passive  range of motion without pain, normal range of motion and neck supple.     Right lower leg: No edema.     Left lower leg: No edema.  Lymphadenopathy:     Upper Body:     Right upper body: No supraclavicular, axillary or pectoral adenopathy.     Left upper body: No supraclavicular, axillary or pectoral adenopathy.  Skin:    General: Skin is warm and dry.     Capillary Refill: Capillary refill takes less than 2 seconds.  Neurological:     General: No focal deficit present.     Mental Status: She is alert and oriented to person, place, and time.  Cranial Nerves: No cranial nerve deficit.     Sensory: No sensory deficit.  Psychiatric:        Mood and Affect: Mood normal.        Behavior: Behavior normal.        Thought Content: Thought content normal.        Judgment: Judgment normal.         Assessment And Plan:     1. Encounter for general adult medical examination w/o abnormal findings . Behavior modifications discussed and diet history reviewed.   . Pt will continue to exercise regularly and modify diet with low GI, plant based foods and decrease intake of processed foods.  . Recommend intake of daily multivitamin, Vitamin D, and calcium.  . Recommend mammogram (up to date) for preventive screenings, as well as recommend immunizations that include influenza, TDAP  2. Class 2 obesity with body mass index (BMI) of 39.0 to 39.9 in adult, unspecified obesity type, unspecified whether serious comorbidity present  Her metabolic functions were all normal  ASK- Given permission to discuss weight today ASSESS - Body mass index is 39.94 kg/m., well tolerated, WAIST CIRCUMFERENCE  ADVISE  - on risk of cardiovascular disease currently has a diagnosis of hypertension, on medications. AGREE - to increase her physical activity of walking back to walking daily to include during lunch time ASSIST - Rx for phentermine sent to pharmacy, if has palpitations call to office.  Long discussion  about her weight and foods to eat - phentermine 15 MG capsule; Take 1 capsule (15 mg total) by mouth every morning.  Dispense: 30 capsule; Refill: 1  Patient was given opportunity to ask questions. Patient verbalized understanding of the plan and was able to repeat key elements of the plan. All questions were answered to their satisfaction.   Minette Brine, FNP   I, Minette Brine, FNP, have reviewed all documentation for this visit. The documentation on 07/21/20 for the exam, diagnosis, procedures, and orders are all accurate and complete.   THE PATIENT IS ENCOURAGED TO PRACTICE SOCIAL DISTANCING DUE TO THE COVID-19 PANDEMIC.

## 2020-07-15 NOTE — Patient Instructions (Addendum)
Health Maintenance, Female Adopting a healthy lifestyle and getting preventive care are important in promoting health and wellness. Ask your health care provider about:  The right schedule for you to have regular tests and exams.  Things you can do on your own to prevent diseases and keep yourself healthy. What should I know about diet, weight, and exercise? Eat a healthy diet  Eat a diet that includes plenty of vegetables, fruits, low-fat dairy products, and lean protein.  Do not eat a lot of foods that are high in solid fats, added sugars, or sodium.   Maintain a healthy weight Body mass index (BMI) is used to identify weight problems. It estimates body fat based on height and weight. Your health care provider can help determine your BMI and help you achieve or maintain a healthy weight. Get regular exercise Get regular exercise. This is one of the most important things you can do for your health. Most adults should:  Exercise for at least 150 minutes each week. The exercise should increase your heart rate and make you sweat (moderate-intensity exercise).  Do strengthening exercises at least twice a week. This is in addition to the moderate-intensity exercise.  Spend less time sitting. Even light physical activity can be beneficial. Watch cholesterol and blood lipids Have your blood tested for lipids and cholesterol at 40 years of age, then have this test every 5 years. Have your cholesterol levels checked more often if:  Your lipid or cholesterol levels are high.  You are older than 40 years of age.  You are at high risk for heart disease. What should I know about cancer screening? Depending on your health history and family history, you may need to have cancer screening at various ages. This may include screening for:  Breast cancer.  Cervical cancer.  Colorectal cancer.  Skin cancer.  Lung cancer. What should I know about heart disease, diabetes, and high blood  pressure? Blood pressure and heart disease  High blood pressure causes heart disease and increases the risk of stroke. This is more likely to develop in people who have high blood pressure readings, are of African descent, or are overweight.  Have your blood pressure checked: ? Every 3-5 years if you are 18-39 years of age. ? Every year if you are 40 years old or older. Diabetes Have regular diabetes screenings. This checks your fasting blood sugar level. Have the screening done:  Once every three years after age 40 if you are at a normal weight and have a low risk for diabetes.  More often and at a younger age if you are overweight or have a high risk for diabetes. What should I know about preventing infection? Hepatitis B If you have a higher risk for hepatitis B, you should be screened for this virus. Talk with your health care provider to find out if you are at risk for hepatitis B infection. Hepatitis C Testing is recommended for:  Everyone born from 1945 through 1965.  Anyone with known risk factors for hepatitis C. Sexually transmitted infections (STIs)  Get screened for STIs, including gonorrhea and chlamydia, if: ? You are sexually active and are younger than 40 years of age. ? You are older than 40 years of age and your health care provider tells you that you are at risk for this type of infection. ? Your sexual activity has changed since you were last screened, and you are at increased risk for chlamydia or gonorrhea. Ask your health care provider   if you are at risk.  Ask your health care provider about whether you are at high risk for HIV. Your health care provider may recommend a prescription medicine to help prevent HIV infection. If you choose to take medicine to prevent HIV, you should first get tested for HIV. You should then be tested every 3 months for as long as you are taking the medicine. Pregnancy  If you are about to stop having your period (premenopausal) and  you may become pregnant, seek counseling before you get pregnant.  Take 400 to 800 micrograms (mcg) of folic acid every day if you become pregnant.  Ask for birth control (contraception) if you want to prevent pregnancy. Osteoporosis and menopause Osteoporosis is a disease in which the bones lose minerals and strength with aging. This can result in bone fractures. If you are 19 years old or older, or if you are at risk for osteoporosis and fractures, ask your health care provider if you should:  Be screened for bone loss.  Take a calcium or vitamin D supplement to lower your risk of fractures.  Be given hormone replacement therapy (HRT) to treat symptoms of menopause. Follow these instructions at home: Lifestyle  Do not use any products that contain nicotine or tobacco, such as cigarettes, e-cigarettes, and chewing tobacco. If you need help quitting, ask your health care provider.  Do not use street drugs.  Do not share needles.  Ask your health care provider for help if you need support or information about quitting drugs. Alcohol use  Do not drink alcohol if: ? Your health care provider tells you not to drink. ? You are pregnant, may be pregnant, or are planning to become pregnant.  If you drink alcohol: ? Limit how much you use to 0-1 drink a day. ? Limit intake if you are breastfeeding.  Be aware of how much alcohol is in your drink. In the [place] one drink equals one 12 oz bottle of beer (355 mL), one 5 oz glass of wine (148 mL), or one 1 oz glass of hard liquor (44 mL). General instructions  Schedule regular health, dental, and eye exams.  Stay current with your vaccines.  Tell your health care provider if: ? You often feel depressed. ? You have ever been abused or do not feel safe at home. Summary  Adopting a healthy lifestyle and getting preventive care are important in promoting health and wellness.  Follow your health care provider's instructions about healthy  diet, exercising, and getting tested or screened for diseases.  Follow your health care provider's instructions on monitoring your cholesterol and blood pressure. This information is not intended to replace advice given to you by your health care provider. Make sure you discuss any questions you have with your health care provider. Document Revised: 03/30/2018 Document Reviewed: 03/30/2018 Elsevier Patient Education  2021 [place].  Limit intake of carbs - try to get in less than 50 grams daily for weight loss Incorporate more strength training.

## 2020-07-17 ENCOUNTER — Other Ambulatory Visit: Payer: Self-pay

## 2020-07-17 ENCOUNTER — Ambulatory Visit: Admitting: Internal Medicine

## 2020-07-17 ENCOUNTER — Encounter: Payer: Self-pay | Admitting: Internal Medicine

## 2020-07-17 VITALS — BP 120/80 | HR 73 | Ht 62.0 in | Wt 212.0 lb

## 2020-07-17 DIAGNOSIS — D352 Benign neoplasm of pituitary gland: Secondary | ICD-10-CM | POA: Diagnosis not present

## 2020-07-17 DIAGNOSIS — E229 Hyperfunction of pituitary gland, unspecified: Secondary | ICD-10-CM

## 2020-07-17 DIAGNOSIS — E669 Obesity, unspecified: Secondary | ICD-10-CM | POA: Diagnosis not present

## 2020-07-17 DIAGNOSIS — R072 Precordial pain: Secondary | ICD-10-CM

## 2020-07-17 MED ORDER — METOPROLOL TARTRATE 50 MG PO TABS: 50.0000 mg | ORAL_TABLET | Freq: Once | ORAL | 0 refills | Status: DC

## 2020-07-17 NOTE — Progress Notes (Signed)
Cardiology Office Note:    Date:  07/17/2020   ID:  Elizabeth Graham, DOB February 26, 1981, MRN [id number]  PCP:  Minette Brine, FNP  Cardiologist:  No primary care provider on file.  Electrophysiologist:  None   Referring MD: Minette Brine, FNP   Chief Complaint/Reason for Referral: Chest pain   History of Present Illness:    Elizabeth Graham is a 40 y.o. female with a history of pituitary microadenoma with hyperprolactinemia.  She presents today for evaluation of chest pain.  Chest pain began in February mid month. Sharp pain, middle of chest.  Sudden onset sharp chest pain, tried to treat this with NSAIDs.  Report of bundle branch block on ECG, however I cannot review this ECG independently and report suggest sinus rhythm with old anterior infarct and a heart rate of 64.  Her ECG today is normal sinus rhythm, no conduction delay, heart rate 73 bpm.  On feb 23 - chest hurting, around 12:30 am, left at 2 am from work, works 3rd shift. Written out of work until 28th, some chest pain 25-28th. Since then has been exercising. No recurrent chest pain with exercise - treadmill and bike, ab machine.   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  - with heavy boxes moderate discomfort.   Mom congestive heart failure. Maybe ischemic.  No other heart disease.   Pituitary adenoma medically treated, stable with no definite recurrence per patient, follows with Dr. Posey Pronto endocrine.   The patient denies dyspnea at rest or with exertion, palpitations, PND, orthopnea, or leg swelling. Denies cough, fever, chills. Denies nausea, vomiting. Denies syncope or presyncope. Denies dizziness or lightheadedness. Denies snoring.  Past Medical History:  Diagnosis Date  . Obesity   . Pituitary microadenoma with hyperprolactinemia ([place]) 2017  . Polygalactia     No past surgical history on file.  Current Medications: Current Meds  Medication Sig  . cetirizine (ZYRTEC) 10 MG tablet Take 1 tablet (10 mg total) by mouth  daily.  . fluticasone (FLONASE) 50 MCG/ACT nasal spray Place 2 sprays into both nostrils daily.  Marland Kitchen ibuprofen (ADVIL,MOTRIN) 800 MG tablet Take 1 tablet (800 mg total) by mouth every 8 (eight) hours as needed for headache.  . metoprolol tartrate (LOPRESSOR) 50 MG tablet Take 1 tablet (50 mg total) by mouth once for 1 dose. PLEASE TAKE METOPROLOL 2  HOURS PRIOR TO CTA SCAN.  . norgestrel-ethinyl estradiol (LO/OVRAL) 0.3-30 MG-MCG tablet Take 1 tablet by mouth daily. Pt needs office visit this month  . phentermine 15 MG capsule Take 1 capsule (15 mg total) by mouth every morning.     Allergies:   Patient has no known allergies.   Social History   Tobacco Use  . Smoking status: Never Smoker  . Smokeless tobacco: Never Used  Substance Use Topics  . Alcohol use: Yes    Comment: SOCIALLY  . Drug use: Not Currently     Family History: The patient's family history includes Diabetes in her mother; Heart disease in her mother; Hypertension in her mother.  ROS:   Please see the history of present illness.    All other systems reviewed and are negative.  EKGs/Labs/Other Studies Reviewed:    The following studies were reviewed today:  EKG:  NSR  Recent Labs: 06/13/2020: ALT 15; BUN 6; Creatinine, Ser 0.95; Hemoglobin 12.9; Platelets 387; Potassium 4.5; Sodium 140; TSH 1.510  Recent Lipid Panel    Component Value Date/Time   CHOL 147 07/11/2019 1149   TRIG 74 07/11/2019 1149  HDL 46 07/11/2019 1149   CHOLHDL 3.2 07/11/2019 1149   LDLCALC 86 07/11/2019 1149    Physical Exam:    VS:  BP 120/80   Pulse 73   Ht 5\' 2"  (1.575 m)   Wt 212 lb (96.2 kg)   LMP 07/12/2020 (Exact Date)   SpO2 97%   BMI 38.78 kg/m     Wt Readings from Last 5 Encounters:  07/17/20 212 lb (96.2 kg)  07/15/20 221 lb 3.2 oz (100.3 kg)  06/13/20 220 lb (99.8 kg)  07/11/19 234 lb (106.1 kg)  06/01/18 230 lb 3.2 oz (104.4 kg)    Constitutional: No acute distress Eyes: sclera non-icteric, normal  conjunctiva and lids ENMT: normal dentition, moist mucous membranes Cardiovascular: regular rhythm, normal rate, no murmurs. S1 and S2 normal. Radial pulses normal bilaterally. No jugular venous distention.  Respiratory: clear to auscultation bilaterally GI : normal bowel sounds, soft and nontender. No distention.   MSK: extremities warm, well perfused. No edema.  NEURO: grossly nonfocal exam, moves all extremities. PSYCH: alert and oriented x 3, normal mood and affect.   ASSESSMENT:    1. Precordial pain   2. Pituitary microadenoma with hyperprolactinemia (HCC)   3. Obesity (BMI 30-39.9)    PLAN:    Precordial pain  -She is having substernal chest discomfort at times with exertion.  Risk factors include obesity and family history of congestive heart failure with her mother possibly having ischemic cardiomyopathy.  We discussed options for stress testing versus observation.  She would prefer to pursue ischemic testing.  Of stress testing options, we decided on coronary CTA.  There is report of abnormal ECG at her PCPs office, therefore I would like to avoid ETT alone. -She has normal lipids and well-controlled blood pressure without medication therapy.  We discussed that if coronary CTA is normal, she is overall low risk.  We will follow results.  Pituitary microadenoma with hyperprolactinemia (HCC)  Obesity (BMI 30-39.9)-recommend diet lifestyle modification, she has been exercising without chest pain, she can continue mild to moderate exercise prior to coronary CTA being completed as long as she has no chest pain.   Cherlynn Kaiser, MD, [facility]    Medication Adjustments/Labs and Tests Ordered: Current medicines are reviewed at length with the patient today.  Concerns regarding medicines are outlined above.   Orders Placed This Encounter  Procedures  . CT CORONARY MORPH W/CTA COR W/SCORE W/CA W/CM &/OR WO/CM  . CT CORONARY FRACTIONAL FLOW RESERVE DATA  PREP  . CT CORONARY FRACTIONAL FLOW RESERVE FLUID ANALYSIS  . Basic metabolic panel  . EKG 12-Lead    Meds ordered this encounter  Medications  . metoprolol tartrate (LOPRESSOR) 50 MG tablet    Sig: Take 1 tablet (50 mg total) by mouth once for 1 dose. PLEASE TAKE METOPROLOL 2  HOURS PRIOR TO CTA SCAN.    Dispense:  1 tablet    Refill:  0    Patient Instructions  Medication Instructions:  PLEASE TAKE METOPROLOL TARTRATE 50mg  (1 TABLET) 2 HOURS PRIOR TO YOUR CCTA  *If you need a refill on your cardiac medications before your next appointment, please call your pharmacy*  Lab Work: BMET- 1 week PRIOR TO CCTA  If you have labs (blood work) drawn today and your tests are completely normal, you will receive your results only by: Marland Kitchen MyChart Message (if you have MyChart) OR . A paper copy in the mail If you have any lab test that  is abnormal or we need to change your treatment, we will call you to review the results.  Testing/Procedures: Your physician has requested that you have cardiac CT. Cardiac computed tomography (CT) is a painless test that uses an x-ray machine to take clear, detailed pictures of your heart. For further information please visit [email or web address].[email or web address]. Please follow instruction sheet as given.  Follow-Up: At [facility], you and your health needs are our priority.  As part of our continuing mission to provide you with exceptional heart care, we have created designated Provider Care Teams.  These Care Teams include your primary Cardiologist (physician) and Advanced Practice Providers (APPs -  Physician Assistants and Nurse Practitioners) who all work together to provide you with the care you need, when you need it.  Your next appointment:   AS NEEDED   The format for your next appointment:   In Person  Provider:   Cherlynn Kaiser, MD  Other Instructions Your cardiac CT will be scheduled at one of the below locations:   [facility] [address] [place], [place] [address] [phone]  If scheduled at [facility], please arrive at the [facility] main entrance (entrance A) of [facility] 30 minutes prior to test start time. Proceed to the [facility] Radiology Department (first floor) to check-in and test prep.  Please follow these instructions carefully (unless otherwise directed):  On the Night Before the Test: . Be sure to Drink plenty of water. . Do not consume any caffeinated/decaffeinated beverages or chocolate 12 hours prior to your test. . Do not take any antihistamines 12 hours prior to your test. . If the patient has contrast allergy:  On the Day of the Test: . Drink plenty of water until 1 hour prior to the test. . Do not eat any food 4 hours prior to the test. . You may take your regular medications prior to the test.  . Take metoprolol (Lopressor) 50mg  (1 TABLET) two hours prior to test. . HOLD Furosemide/Hydrochlorothiazide morning of the test. . FEMALES- please wear underwire-free bra if available  After the Test: . Drink plenty of water. . After receiving IV contrast, you may experience a mild flushed feeling. This is normal. . On occasion, you may experience a mild rash up to 24 hours after the test. This is not dangerous. If this occurs, you can take Benadryl 25 mg and increase your fluid intake. . If you experience trouble breathing, this can be serious. If it is severe call 911 IMMEDIATELY. If it is mild, please call our office. . If you take any of these medications: Glipizide/Metformin, Avandament, Glucavance, please do not take 48 hours after completing test unless otherwise instructed.  Once we have confirmed authorization from your insurance company, we will call you to set up a date and time for your test. Based on how quickly your insurance processes prior authorizations requests, please allow up to 4 weeks to be contacted for scheduling your Cardiac CT appointment. Be advised that  routine Cardiac CT appointments could be scheduled as many as 8 weeks after your provider has ordered it.  For non-scheduling related questions, please contact the cardiac imaging nurse navigator should you have any questions/concerns: Marchia Bond, Cardiac Imaging Nurse Navigator Gordy Clement, Cardiac Imaging Nurse Navigator [facility] Direct Office Dial: [phone]   For scheduling needs, including cancellations and rescheduling, please call [place], [phone].

## 2020-07-17 NOTE — Patient Instructions (Signed)
Medication Instructions:  PLEASE TAKE METOPROLOL TARTRATE 50mg  (1 TABLET) 2 HOURS PRIOR TO YOUR CCTA  *If you need a refill on your cardiac medications before your next appointment, please call your pharmacy*  Lab Work: BMET- 1 week PRIOR TO CCTA  If you have labs (blood work) drawn today and your tests are completely normal, you will receive your results only by: Marland Kitchen MyChart Message (if you have MyChart) OR . A paper copy in the mail If you have any lab test that is abnormal or we need to change your treatment, we will call you to review the results.  Testing/Procedures: Your physician has requested that you have cardiac CT. Cardiac computed tomography (CT) is a painless test that uses an x-ray machine to take clear, detailed pictures of your heart. For further information please visit HugeFiesta.[email or web address]. Please follow instruction sheet as given.  Follow-Up: At [facility], you and your health needs are our priority.  As part of our continuing mission to provide you with exceptional heart care, we have created designated Provider Care Teams.  These Care Teams include your primary Cardiologist (physician) and Advanced Practice Providers (APPs -  Physician Assistants and Nurse Practitioners) who all work together to provide you with the care you need, when you need it.  Your next appointment:   AS NEEDED   The format for your next appointment:   In Person  Provider:   Cherlynn Kaiser, MD  Other Instructions Your cardiac CT will be scheduled at one of the below locations:   [facility] [address] [place], [place] [address] [phone]  If scheduled at [facility], please arrive at the [facility] main entrance (entrance A) of [facility] 30 minutes prior to test start time. Proceed to the [facility] Radiology Department (first floor) to check-in and test prep.  Please follow these instructions carefully (unless otherwise directed):  On the  Night Before the Test: . Be sure to Drink plenty of water. . Do not consume any caffeinated/decaffeinated beverages or chocolate 12 hours prior to your test. . Do not take any antihistamines 12 hours prior to your test. . If the patient has contrast allergy:  On the Day of the Test: . Drink plenty of water until 1 hour prior to the test. . Do not eat any food 4 hours prior to the test. . You may take your regular medications prior to the test.  . Take metoprolol (Lopressor) 50mg  (1 TABLET) two hours prior to test. . HOLD Furosemide/Hydrochlorothiazide morning of the test. . FEMALES- please wear underwire-free bra if available  After the Test: . Drink plenty of water. . After receiving IV contrast, you may experience a mild flushed feeling. This is normal. . On occasion, you may experience a mild rash up to 24 hours after the test. This is not dangerous. If this occurs, you can take Benadryl 25 mg and increase your fluid intake. . If you experience trouble breathing, this can be serious. If it is severe call 911 IMMEDIATELY. If it is mild, please call our office. . If you take any of these medications: Glipizide/Metformin, Avandament, Glucavance, please do not take 48 hours after completing test unless otherwise instructed.  Once we have confirmed authorization from your insurance company, we will call you to set up a date and time for your test. Based on how quickly your insurance processes prior authorizations requests, please allow up to 4 weeks to be contacted for scheduling your Cardiac CT  appointment. Be advised that routine Cardiac CT appointments could be scheduled as many as 8 weeks after your provider has ordered it.  For non-scheduling related questions, please contact the cardiac imaging nurse navigator should you have any questions/concerns: Marchia Bond, Cardiac Imaging Nurse Navigator Gordy Clement, Cardiac Imaging Nurse Navigator [facility] Direct Office Dial: [phone]   For scheduling needs, including cancellations and rescheduling, please call [place], [phone].

## 2020-07-24 ENCOUNTER — Telehealth (HOSPITAL_COMMUNITY): Payer: Self-pay | Admitting: Emergency Medicine

## 2020-07-24 NOTE — Telephone Encounter (Signed)
Reaching out to patient to offer assistance regarding upcoming cardiac imaging study; pt verbalizes understanding of appt date/time, parking situation and where to check in, pre-test NPO status and medications ordered, and verified current allergies; name and call back number provided for further questions should they arise Marchia Bond RN Navigator Cardiac Imaging [facility] [phone] office [phone] cell  Holding allergy meds and weight loss meds. 100mg  metoprolol tartrate 2 hr prior to scan Clarise Cruz

## 2020-07-26 ENCOUNTER — Encounter: Admitting: *Deleted

## 2020-07-26 ENCOUNTER — Ambulatory Visit (HOSPITAL_COMMUNITY)
Admission: RE | Admit: 2020-07-26 | Discharge: 2020-07-26 | Disposition: A | Discharge status: Home / Self Care | Source: Ambulatory Visit | Attending: Internal Medicine | Admitting: Internal Medicine

## 2020-07-26 ENCOUNTER — Other Ambulatory Visit: Payer: Self-pay

## 2020-07-26 DIAGNOSIS — Z006 Encounter for examination for normal comparison and control in clinical research program: Secondary | ICD-10-CM

## 2020-07-26 DIAGNOSIS — R072 Precordial pain: Secondary | ICD-10-CM | POA: Insufficient documentation

## 2020-07-26 MED ORDER — NITROGLYCERIN 0.4 MG SL SUBL
0.8000 mg | SUBLINGUAL_TABLET | Freq: Once | SUBLINGUAL | Status: AC
  Administered 2020-07-26: 0.8 mg via SUBLINGUAL

## 2020-07-26 MED ORDER — NITROGLYCERIN 0.4 MG SL SUBL
SUBLINGUAL_TABLET | SUBLINGUAL | Status: AC
  Filled 2020-07-26: qty 2

## 2020-07-26 MED ORDER — IOHEXOL 350 MG/ML SOLN
80.0000 mL | Freq: Once | INTRAVENOUS | Status: AC | PRN
  Administered 2020-07-26: 80 mL via INTRAVENOUS

## 2020-07-26 NOTE — Research (Signed)
IDENTIFY Informed Consent                  Subject Name:   Elizabeth Graham   Subject met inclusion and exclusion criteria.  The informed consent form, study requirements and expectations were reviewed with the subject and questions and concerns were addressed prior to the signing of the consent form.  The subject verbalized understanding of the trial requirements.  The subject agreed to participate in the IDENTIFY trial and signed the informed consent.  The informed consent was obtained prior to performance of any protocol-specific procedures for the subject.  A copy of the signed informed consent was given to the subject and a copy was placed in the subject's medical record.   [place] Romolo Sieling, Research Assistant  07/26/2020 09:34 a.m.

## 2020-09-11 ENCOUNTER — Encounter: Payer: Self-pay | Admitting: Nurse Practitioner

## 2020-09-17 ENCOUNTER — Ambulatory Visit (INDEPENDENT_AMBULATORY_CARE_PROVIDER_SITE_OTHER): Admitting: Nurse Practitioner

## 2020-09-17 ENCOUNTER — Encounter: Payer: Self-pay | Admitting: Nurse Practitioner

## 2020-09-17 ENCOUNTER — Other Ambulatory Visit: Payer: Self-pay

## 2020-09-17 VITALS — Ht 62.0 in | Wt 205.0 lb

## 2020-09-17 DIAGNOSIS — U071 COVID-19: Secondary | ICD-10-CM | POA: Diagnosis not present

## 2020-09-17 DIAGNOSIS — Z6837 Body mass index (BMI) 37.0-37.9, adult: Secondary | ICD-10-CM | POA: Diagnosis not present

## 2020-09-17 DIAGNOSIS — E6609 Other obesity due to excess calories: Secondary | ICD-10-CM | POA: Diagnosis not present

## 2020-09-17 MED ORDER — PHENTERMINE HCL 37.5 MG PO CAPS
37.5000 mg | ORAL_CAPSULE | ORAL | 1 refills | Status: DC
Start: 2020-09-17 — End: 2020-11-21

## 2020-09-17 NOTE — Patient Instructions (Signed)

## 2020-09-17 NOTE — Progress Notes (Signed)
Virtual Visit via MyChart   This visit type was conducted due to national recommendations for restrictions regarding the COVID-19 Pandemic (e.g. social distancing) in an effort to limit this patient's exposure and mitigate transmission in our community.  Due to her co-morbid illnesses, this patient is at least at moderate risk for complications without adequate follow up.  This format is felt to be most appropriate for this patient at this time.  All issues noted in this document were discussed and addressed.  A limited physical exam was performed with this format.    This visit type was conducted due to national recommendations for restrictions regarding the COVID-19 Pandemic (e.g. social distancing) in an effort to limit this patient's exposure and mitigate transmission in our community.  Patients identity confirmed using two different identifiers.  This format is felt to be most appropriate for this patient at this time.  All issues noted in this document were discussed and addressed.  No physical exam was performed (except for noted visual exam findings with Video Visits).    Date:  10/13/2020   ID:  Elizabeth Graham, DOB December 17, 1980, MRN [id number]  Patient Location:  Home - spoke with Elizabeth Graham  Provider location:   Office    Chief Complaint:  Weight check and she also tested positive for covid.   History of Present Illness:    Elizabeth Graham is a 40 y.o. female who presents via video conferencing for a telehealth visit today.    The patient does have symptoms concerning for COVID-19 infection (fever, chills, cough, or new shortness of breath).   Virtual visit for weight check.  Wt Readings from Last 3 Encounters: 09/17/20 : 205 lb (93 kg) 07/17/20 : 212 lb (96.2 kg) 07/15/20 : 221 lb 3.2 oz (100.3 kg)   She exercises Mon - Friday with strength training - abs and upper body then will ride the bike 15 minutes at a time. She reports she is unable to eat as much as she did  before. She has been drinking water for her liquid intake. She can barely finish a whole can of soda. She has cut out a good amount of sugar as well.   She also is positive for covid as of Tuesday last week. She began having symptoms on Friday. Denies shortness of breath or chest pain. She had been having a sore throat which is better. She had a fever for a "little bit".  She is only taking hair and nail vitamins. She does go to work in the office. She had been out of work for 5 days but still had a fever and is to continue to be out of work with [employer].      Past Medical History:  Diagnosis Date   Obesity    Pituitary microadenoma with hyperprolactinemia ([place]) 2017   Polygalactia    No past surgical history on file.   Current Meds  Medication Sig   cetirizine (ZYRTEC) 10 MG tablet Take 1 tablet (10 mg total) by mouth daily.   fluticasone (FLONASE) 50 MCG/ACT nasal spray Place 2 sprays into both nostrils daily.   ibuprofen (ADVIL,MOTRIN) 800 MG tablet Take 1 tablet (800 mg total) by mouth every 8 (eight) hours as needed for headache.   norgestrel-ethinyl estradiol (LO/OVRAL) 0.3-30 MG-MCG tablet Take 1 tablet by mouth daily. Pt needs office visit this month   phentermine 37.5 MG capsule Take 1 capsule (37.5 mg total) by mouth every morning.   [DISCONTINUED] phentermine 15 MG  capsule Take 1 capsule (15 mg total) by mouth every morning.     Allergies:   Patient has no known allergies.   Social History   Tobacco Use   Smoking status: Never   Smokeless tobacco: Never  Substance Use Topics   Alcohol use: Yes    Comment: SOCIALLY   Drug use: Not Currently     Family Hx: The patient's family history includes Diabetes in her mother; Heart disease in her mother; Hypertension in her mother.  ROS:   Please see the history of present illness.    Review of Systems  Constitutional: Negative.  Negative for malaise/fatigue.  Respiratory:  Positive for cough. Negative for sputum  production, shortness of breath and wheezing.   Cardiovascular: Negative.  Negative for chest pain.  Neurological:  Negative for dizziness and tingling.  Psychiatric/Behavioral: Negative.     All other systems reviewed and are negative.   Labs/Other Tests and Data Reviewed:    Recent Labs: 06/13/2020: ALT 15; BUN 6; Creatinine, Ser 0.95; Hemoglobin 12.9; Platelets 387; Potassium 4.5; Sodium 140; TSH 1.510   Recent Lipid Panel Lab Results  Component Value Date/Time   CHOL 147 07/11/2019 11:49 AM   TRIG 74 07/11/2019 11:49 AM   HDL 46 07/11/2019 11:49 AM   CHOLHDL 3.2 07/11/2019 11:49 AM   LDLCALC 86 07/11/2019 11:49 AM    Wt Readings from Last 3 Encounters:  09/17/20 205 lb (93 kg)  07/17/20 212 lb (96.2 kg)  07/15/20 221 lb 3.2 oz (100.3 kg)     Exam:    Vital Signs:  Ht 5\' 2"  (1.575 m)   Wt 205 lb (93 kg)   LMP 09/10/2020   BMI 37.49 kg/m     Physical Exam Vitals reviewed.  Constitutional:      General: She is not in acute distress.    Appearance: Normal appearance. She is obese.  Cardiovascular:     Rate and Rhythm: Normal rate and regular rhythm.     Pulses: Normal pulses.     Heart sounds: Normal heart sounds. No murmur heard. Pulmonary:     Effort: Pulmonary effort is normal. No respiratory distress.     Breath sounds: Normal breath sounds. No wheezing.  Neurological:     General: No focal deficit present.     Mental Status: She is alert and oriented to person, place, and time.     Cranial Nerves: No cranial nerve deficit.     Motor: No weakness.  Psychiatric:        Mood and Affect: Mood normal.        Behavior: Behavior normal.        Thought Content: Thought content normal.        Judgment: Judgment normal.    ASSESSMENT & PLAN:    1. Class 2 obesity due to excess calories with body mass index (BMI) of 37.0 to 37.9 in adult, unspecified whether serious comorbidity present She has lost 7 lbs since her last visit.  Will increase phentermine to  37.5 - phentermine 37.5 MG capsule; Take 1 capsule (37.5 mg total) by mouth every morning.  Dispense: 30 capsule; Refill: 1  2. COVID-19 Feels like her symptoms are mild. She does not need a work note. If symptoms worsen return call to office or go to ER   COVID-19 Education: The signs and symptoms of COVID-19 were discussed with the patient and how to seek care for testing (follow up with PCP or arrange E-visit).  The importance  of social distancing was discussed today.  Patient Risk:   After full review of this patients clinical status, I feel that they are at least moderate risk at this time.  Time:   Today, I have spent 14 minutes/ seconds with the patient with telehealth technology discussing above diagnoses.     Medication Adjustments/Labs and Tests Ordered: Current medicines are reviewed at length with the patient today.  Concerns regarding medicines are outlined above.   Tests Ordered: No orders of the defined types were placed in this encounter.   Medication Changes: Meds ordered this encounter  Medications   phentermine 37.5 MG capsule    Sig: Take 1 capsule (37.5 mg total) by mouth every morning.    Dispense:  30 capsule    Refill:  1    Disposition:  Follow up in 2 month(s)  Signed, Minette Brine, FNP

## 2020-09-26 ENCOUNTER — Other Ambulatory Visit: Payer: Self-pay | Admitting: Nurse Practitioner

## 2020-09-26 DIAGNOSIS — Z308 Encounter for other contraceptive management: Secondary | ICD-10-CM

## 2020-10-13 ENCOUNTER — Encounter: Payer: Self-pay | Admitting: Nurse Practitioner

## 2020-11-21 ENCOUNTER — Ambulatory Visit (INDEPENDENT_AMBULATORY_CARE_PROVIDER_SITE_OTHER): Admitting: Nurse Practitioner

## 2020-11-21 ENCOUNTER — Other Ambulatory Visit: Payer: Self-pay

## 2020-11-21 ENCOUNTER — Encounter: Payer: Self-pay | Admitting: Nurse Practitioner

## 2020-11-21 VITALS — BP 122/66 | HR 90 | Temp 98.8°F | Ht 62.0 in | Wt 192.4 lb

## 2020-11-21 DIAGNOSIS — Z308 Encounter for other contraceptive management: Secondary | ICD-10-CM | POA: Diagnosis not present

## 2020-11-21 DIAGNOSIS — Z2821 Immunization not carried out because of patient refusal: Secondary | ICD-10-CM | POA: Diagnosis not present

## 2020-11-21 DIAGNOSIS — E6609 Other obesity due to excess calories: Secondary | ICD-10-CM

## 2020-11-21 DIAGNOSIS — Z3041 Encounter for surveillance of contraceptive pills: Secondary | ICD-10-CM | POA: Diagnosis not present

## 2020-11-21 DIAGNOSIS — Z6835 Body mass index (BMI) 35.0-35.9, adult: Secondary | ICD-10-CM

## 2020-11-21 MED ORDER — PHENTERMINE HCL 37.5 MG PO CAPS: 37.5000 mg | ORAL_CAPSULE | ORAL | 1 refills | Status: DC

## 2020-11-21 MED ORDER — NORGESTREL-ETHINYL ESTRADIOL 0.3-30 MG-MCG PO TABS: 1.0000 | ORAL_TABLET | Freq: Every day | ORAL | 3 refills | Status: DC

## 2020-11-21 NOTE — Patient Instructions (Signed)
Obesity, Adult Obesity is having too much body fat. Being obese means that your weight is morethan what is healthy for you. BMI is a number that explains how much body fat you have. If you have a BMI of 30 or more, you are obese. Obesity is often caused by eating or drinking morecalories than your body uses. Changing your lifestyle can help you lose weight. Obesity can cause serious health problems, such as: Stroke. Coronary artery disease (CAD). Type 2 diabetes. Some types of cancer, including cancers of the colon, breast, uterus, and gallbladder. Osteoarthritis. High blood pressure (hypertension). High cholesterol. Sleep apnea. Gallbladder stones. Infertility problems. What are the causes? Eating meals each day that are high in calories, sugar, and fat. Being born with genes that may make you more likely to become obese. Having a medical condition that causes obesity. Taking certain medicines. Sitting a lot (having a sedentary lifestyle). Not getting enough sleep. Drinking a lot of drinks that have sugar in them. What increases the risk? Having a family history of obesity. Being an [place] American woman. Being a Hispanic man. Living in an area with limited access to: [place], recreation centers, or sidewalks. Healthy food choices, such as grocery stores and farmers' markets. What are the signs or symptoms? The main sign is having too much body fat. How is this treated? Treatment for this condition often includes changing your lifestyle. Treatment may include: Changing your diet. This may include making a healthy meal plan. Exercise. This may include activity that causes your heart to beat faster (aerobic exercise) and strength training. Work with your doctor to design a program that works for you. Medicine to help you lose weight. This may be used if you are not able to lose 1 pound a week after 6 weeks of healthy eating and more exercise. Treating conditions that cause the  obesity. Surgery. Options may include gastric banding and gastric bypass. This may be done if: Other treatments have not helped to improve your condition. You have a BMI of 40 or higher. You have life-threatening health problems related to obesity. Follow these instructions at home: Eating and drinking  Follow advice from your doctor about what to eat and drink. Your doctor may tell you to: Limit fast food, sweets, and processed snack foods. Choose low-fat options. For example, choose low-fat milk instead of whole milk. Eat 5 or more servings of fruits or vegetables each day. Eat at home more often. This gives you more control over what you eat. Choose healthy foods when you eat out. Learn to read food labels. This will help you learn how much food is in 1 serving. Keep low-fat snacks available. Avoid drinks that have a lot of sugar in them. These include soda, fruit juice, iced tea with sugar, and flavored milk. Drink enough water to keep your pee (urine) pale yellow. Do not go on fad diets.  Physical activity Exercise often, as told by your doctor. Most adults should get up to 150 minutes of moderate-intensity exercise every week.Ask your doctor: What types of exercise are safe for you. How often you should exercise. Warm up and stretch before being active. Do slow stretching after being active (cool down). Rest between times of being active. Lifestyle Work with your doctor and a food expert (dietitian) to set a weight-loss goal that is best for you. Limit your screen time. Find ways to reward yourself that do not involve food. Do not drink alcohol if: Your doctor tells you not to drink.  You are pregnant, may be pregnant, or are planning to become pregnant. If you drink alcohol: Limit how much you use to: 0-1 drink a day for women. 0-2 drinks a day for men. Be aware of how much alcohol is in your drink. In the [place] one drink equals one 12 oz bottle of beer (355 mL), one 5 oz  glass of wine (148 mL), or one 1 oz glass of hard liquor (44 mL). General instructions Keep a weight-loss journal. This can help you keep track of: The food that you eat. How much exercise you get. Take over-the-counter and prescription medicines only as told by your doctor. Take vitamins and supplements only as told by your doctor. Think about joining a support group. Keep all follow-up visits as told by your doctor. This is important. Contact a doctor if: You cannot meet your weight loss goal after you have changed your diet and lifestyle for 6 weeks. Get help right away if you: Are having trouble breathing. Are having thoughts of harming yourself. Summary Obesity is having too much body fat. Being obese means that your weight is more than what is healthy for you. Work with your doctor to set a weight-loss goal. Get regular exercise as told by your doctor. This information is not intended to replace advice given to you by your health care provider. Make sure you discuss any questions you have with your healthcare provider. Document Revised: 12/09/2017 Document Reviewed: 12/09/2017 Elsevier Patient Education  2022 [employer].

## 2020-11-21 NOTE — Progress Notes (Signed)
I,Tianna Badgett,acting as a Education administrator for [employer], FNP.,have documented all relevant documentation on the behalf of Minette Brine, FNP,as directed by  Minette Brine, FNP while in the presence of Minette Brine, [place].  This visit occurred during the SARS-CoV-2 public health emergency.  Safety protocols were in place, including screening questions prior to the visit, additional usage of staff PPE, and extensive cleaning of exam room while observing appropriate contact time as indicated for disinfecting solutions.  Subjective:     Patient ID: Elizabeth Graham , female    DOB: [birth year] , 40 y.o.   MRN: [id number]   Chief Complaint  Patient presents with   Weight Check    HPI  Patient is here for weight check. She is exercising throughout the day. She is not working currently. Diet - she has been eating more junk while on vacation. Continues to take phentermine 37.5 mg daily no adverse reactions.   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   Polygalactia      Family History  Problem Relation Age of Onset   Diabetes Mother    Hypertension Mother    Heart disease Mother      Current Outpatient Medications:    cetirizine (ZYRTEC) 10 MG tablet, Take 1 tablet (10 mg total) by mouth daily., Disp: 90 tablet, Rfl: 1   fluticasone (FLONASE) 50 MCG/ACT nasal spray, Place 2 sprays into both nostrils daily., Disp: , Rfl:    ibuprofen (ADVIL,MOTRIN) 800 MG tablet, Take 1 tablet (800 mg total) by mouth every 8 (eight) hours as needed for headache., Disp: 90 tablet, Rfl: 0   norgestrel-ethinyl estradiol (LO/OVRAL) 0.3-30 MG-MCG tablet, Take 1 tablet by mouth daily., Disp: 90 tablet, Rfl: 3   phentermine 37.5 MG capsule, Take 1 capsule (37.5 mg total) by mouth every morning., Disp:  30 capsule, Rfl: 1   No Known Allergies   Review of Systems  Constitutional: Negative.   Respiratory: Negative.    Cardiovascular: Negative.   Gastrointestinal: Negative.   Neurological: Negative.     Today's Vitals   11/21/20 1142  BP: 122/66  Pulse: 90  Temp: 98.8 F (37.1 C)  TempSrc: Oral  Weight: 192 lb 6.4 oz (87.3 kg)  Height: '5\' 2"'$  (1.575 m)   Body mass index is 35.19 kg/m.  Wt Readings from Last 3 Encounters:  11/21/20 192 lb 6.4 oz (87.3 kg)  09/17/20 205 lb (93 kg)  07/17/20 212 lb (96.2 kg)    Objective:  Physical Exam Vitals reviewed.  Constitutional:      General: She is not in acute distress.    Appearance: Normal appearance. She is well-developed. She is obese.  Cardiovascular:     Rate and Rhythm: Normal rate and regular rhythm.     Pulses: Normal pulses.     Heart sounds: Normal heart sounds. No murmur heard. Pulmonary:     Effort: Pulmonary effort is normal. No respiratory distress.     Breath sounds: Normal breath sounds. No wheezing.  Chest:     Chest wall: No tenderness.  Skin:    General: Skin is warm and dry.     Capillary Refill: Capillary refill takes less than 2 seconds.  Neurological:     General: No focal  deficit present.     Mental Status: She is alert and oriented to person, place, and time.     Cranial Nerves: No cranial nerve deficit.  Psychiatric:        Mood and Affect: Mood normal.        Behavior: Behavior normal.        Thought Content: Thought content normal.        Judgment: Judgment normal.        Assessment And Plan:     1. Class 2 obesity due to excess calories without serious comorbidity with body mass index (BMI) of 35.0 to 35.9 in adult Comments: Congratulated on 13 lb weight loss Continue phentermine 37.5 mg daily She is encouraged to strive for BMI less than 30 to Continue exercising regularly - phentermine 37.5 MG capsule; Take 1 capsule (37.5 mg total) by mouth every morning.  Dispense: 30 capsule;  Refill: 1  2. Tetanus, diphtheria, and acellular pertussis (Tdap) vaccination declined Comments: Wants to wait until another visit  3. Encounter for other contraceptive management - norgestrel-ethinyl estradiol (LO/OVRAL) 0.3-30 MG-MCG tablet; Take 1 tablet by mouth daily.  Dispense: 90 tablet; Refill: 3  4. Encounter for surveillance of contraceptive pills - norgestrel-ethinyl estradiol (LO/OVRAL) 0.3-30 MG-MCG tablet; Take 1 tablet by mouth daily.  Dispense: 90 tablet; Refill: 3     Patient was given opportunity to ask questions. Patient verbalized understanding of the plan and was able to repeat key elements of the plan. All questions were answered to their satisfaction.  Minette Brine, FNP   I, Minette Brine, FNP, have reviewed all documentation for this visit. The documentation on 11/21/20 for the exam, diagnosis, procedures, and orders are all accurate and complete.   IF YOU HAVE BEEN REFERRED TO A SPECIALIST, IT MAY TAKE 1-2 WEEKS TO SCHEDULE/PROCESS THE REFERRAL. IF YOU HAVE NOT HEARD FROM US/SPECIALIST IN TWO WEEKS, PLEASE GIVE [place] A CALL AT [phone].   THE PATIENT IS ENCOURAGED TO PRACTICE SOCIAL DISTANCING DUE TO THE COVID-19 PANDEMIC.

## 2021-01-23 ENCOUNTER — Encounter: Payer: Self-pay | Admitting: Nurse Practitioner

## 2021-01-23 ENCOUNTER — Other Ambulatory Visit: Payer: Self-pay

## 2021-01-23 ENCOUNTER — Ambulatory Visit (INDEPENDENT_AMBULATORY_CARE_PROVIDER_SITE_OTHER): Admitting: Nurse Practitioner

## 2021-01-23 VITALS — BP 114/80 | HR 79 | Temp 98.1°F | Ht 62.0 in | Wt 185.0 lb

## 2021-01-23 DIAGNOSIS — Z6835 Body mass index (BMI) 35.0-35.9, adult: Secondary | ICD-10-CM | POA: Diagnosis not present

## 2021-01-23 DIAGNOSIS — Z23 Encounter for immunization: Secondary | ICD-10-CM | POA: Diagnosis not present

## 2021-01-23 DIAGNOSIS — E6609 Other obesity due to excess calories: Secondary | ICD-10-CM | POA: Diagnosis not present

## 2021-01-23 MED ORDER — PHENTERMINE HCL 37.5 MG PO CAPS: 37.5000 mg | ORAL_CAPSULE | ORAL | 1 refills | Status: DC

## 2021-01-23 NOTE — Patient Instructions (Signed)

## 2021-01-23 NOTE — Progress Notes (Signed)
I,Katawbba Wiggins,acting as a Education administrator for [employer], FNP.,have documented all relevant documentation on the behalf of Minette Brine, FNP,as directed by  Minette Brine, FNP while in the presence of Minette Brine, [place].   This visit occurred during the SARS-CoV-2 public health emergency.  Safety protocols were in place, including screening questions prior to the visit, additional usage of staff PPE, and extensive cleaning of exam room while observing appropriate contact time as indicated for disinfecting solutions.  Subjective:     Patient ID: Elizabeth Graham , female    DOB: [birth year] , 40 y.o.   MRN: [id number]   Chief Complaint  Patient presents with   Obesity    HPI  Patient is here for weight check. The patient states she needs a refill on her Phentermine, continues to do well. Continues to exercise 3 times a week approximately 1.5 hours. Doing well with her diet. She drinks approximately 5-6 (16 oz) bottles of water a day.           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   Polygalactia      Family History  Problem Relation Age of Onset   Diabetes Mother    Hypertension Mother    Heart disease Mother      Current Outpatient Medications:    cetirizine (ZYRTEC) 10 MG tablet, Take 1 tablet (10 mg total) by mouth daily., Disp: 90 tablet, Rfl: 1   fluticasone (FLONASE) 50 MCG/ACT nasal spray, Place 2 sprays into both nostrils daily., Disp: , Rfl:    ibuprofen (ADVIL,MOTRIN) 800 MG tablet, Take 1 tablet (800 mg total) by mouth every 8 (eight) hours as needed for headache., Disp: 90 tablet, Rfl: 0   norgestrel-ethinyl estradiol (LO/OVRAL) 0.3-30 MG-MCG tablet, Take 1 tablet by mouth daily., Disp: 90 tablet, Rfl: 3   phentermine 37.5 MG capsule, Take 1 capsule (37.5 mg total) by mouth every morning., Disp: 30 capsule, Rfl: 1   No Known Allergies   Review of Systems  Constitutional: Negative.    Respiratory: Negative.    Cardiovascular: Negative.   Gastrointestinal: Negative.   Neurological:  Negative for dizziness and headaches.  Psychiatric/Behavioral: Negative.    All other systems reviewed and are negative.   Today's Vitals   01/23/21 1055  BP: 114/80  Pulse: 79  Temp: 98.1 F (36.7 C)  Weight: 185 lb (83.9 kg)  Height: 5\' 2"  (1.575 m)   Body mass index is 33.84 kg/m.  Wt Readings from Last 3 Encounters:  01/23/21 185 lb (83.9 kg)  11/21/20 192 lb 6.4 oz (87.3 kg)  09/17/20 205 lb (93 kg)    BP Readings from Last 3 Encounters:  01/23/21 114/80  11/21/20 122/66  07/26/20 118/64    Objective:  Physical Exam Vitals reviewed.  Constitutional:      General: She is not in acute distress.    Appearance: Normal appearance.  Cardiovascular:     Rate and Rhythm: Normal rate and regular rhythm.     Pulses: Normal pulses.     Heart sounds: Normal heart sounds. No murmur heard. Pulmonary:     Effort: Pulmonary effort is normal. No respiratory distress.     Breath sounds: Normal breath sounds. No wheezing.  Skin:    Capillary Refill: Capillary refill takes less than 2 seconds.  Neurological:     General: No focal deficit present.     Mental Status: She is alert and oriented to person, place,  and time.     Cranial Nerves: No cranial nerve deficit.     Motor: No weakness.  Psychiatric:        Mood and Affect: Mood normal.        Behavior: Behavior normal.        Thought Content: Thought content normal.        Judgment: Judgment normal.        Assessment And Plan:     1. Class 2 obesity due to excess calories without serious comorbidity with body mass index (BMI) of 35.0 to 35.9 in adult Comments: Congratulated on 13 lb weight loss Continue phentermine 37.5 mg daily She is encouraged to strive for BMI less than 30 to Continue exercising regularly - phentermine 37.5 MG capsule; Take 1 capsule (37.5 mg total) by mouth every morning.  Dispense: 30 capsule;  Refill: 1  2. Encounter for immunization Will give tetanus vaccine today while in office. Refer to order management. TDAP will be administered to adults 31-55 years old every 10 years. - Tdap vaccine greater than or equal to 7yo IM    Patient was given opportunity to ask questions. Patient verbalized understanding of the plan and was able to repeat key elements of the plan. All questions were answered to their satisfaction.  Minette Brine, FNP   I, Minette Brine, FNP, have reviewed all documentation for this visit. The documentation on 01/23/21 for the exam, diagnosis, procedures, and orders are all accurate and complete.   IF YOU HAVE BEEN REFERRED TO A SPECIALIST, IT MAY TAKE 1-2 WEEKS TO SCHEDULE/PROCESS THE REFERRAL. IF YOU HAVE NOT HEARD FROM US/SPECIALIST IN TWO WEEKS, PLEASE GIVE [place] A CALL AT [phone].   THE PATIENT IS ENCOURAGED TO PRACTICE SOCIAL DISTANCING DUE TO THE COVID-19 PANDEMIC.

## 2021-02-11 ENCOUNTER — Ambulatory Visit: Admitting: Nurse Practitioner

## 2021-02-12 ENCOUNTER — Other Ambulatory Visit: Payer: Self-pay

## 2021-02-12 ENCOUNTER — Encounter: Payer: Self-pay | Admitting: Nurse Practitioner

## 2021-02-12 ENCOUNTER — Ambulatory Visit (INDEPENDENT_AMBULATORY_CARE_PROVIDER_SITE_OTHER): Admitting: Nurse Practitioner

## 2021-02-12 ENCOUNTER — Other Ambulatory Visit (HOSPITAL_COMMUNITY)
Admission: RE | Admit: 2021-02-12 | Discharge: 2021-02-12 | Disposition: A | Discharge status: Home / Self Care | Source: Ambulatory Visit | Attending: Nurse Practitioner | Admitting: Nurse Practitioner

## 2021-02-12 VITALS — BP 122/88 | HR 85 | Temp 98.0°F | Ht 62.0 in | Wt 188.4 lb

## 2021-02-12 DIAGNOSIS — Z113 Encounter for screening for infections with a predominantly sexual mode of transmission: Secondary | ICD-10-CM

## 2021-02-12 NOTE — Patient Instructions (Signed)
Preventing Sexually Transmitted Infections, Adult  Sexually transmitted infections (STIs) are diseases that are spread from person to person (are contagious). They are spread, or transmitted, through bodily fluids exchanged during sex or sexual contact. These bodily fluids include saliva, semen, blood, vaginal mucus, and urine. STIs are very common among people of all ages.  Some common STIs include:  Herpes.  Hepatitis B.  Chlamydia.  Gonorrhea.  Syphilis.  HPV (human papillomavirus).  HIV, also called the human immunodeficiency virus. This is the virus that can cause AIDS (acquired immunodeficiency syndrome).  Often, people who have these STIs do not have symptoms. Even without symptoms, these infections can be spread from person to person and require treatment.  How can these conditions affect me?  STIs can be treated, and many STIs can be cured. However, some STIs cannot be cured and will affect you for the rest of your life.  Certain STIs may:  Require you to take medicine for the rest of your life.  Affect your ability to have children (your fertility).  Increase your risk for developing another STI or certain serious health conditions. These may include:  Cervical cancer.  Head and neck cancer.  Pelvic inflammatory disease (PID), in women.  Organ damage or damage to other parts of your body, if the infection spreads.  Cause problems during pregnancy and may be transmitted to the baby during the pregnancy or childbirth.  What can increase my risk?  You may have an increased risk for developing an STI if:  You have unprotected sex. Sex includes oral, vaginal, or anal sex.  You have more than one sex partner.  You have a sex partner who has multiple sex partners.  You have sex with someone who has an STI.  You have an STI or you had an STI before.  You inject drugs or have a sex partner or partners who inject drugs.  What actions can I take to prevent STIs?  The only way to completely prevent STIs is not to have  sex of any kind. This is called practicing abstinence. If you are sexually active, you can protect yourself and others by taking these actions to lower your risk of getting an STI:  Lifestyle  Avoid mixing alcohol, drugs, and sex. Alcohol and drug use can affect your ability to make good decisions and can lead to risky sexual behaviors.  Medicines  Ask your health care provider about taking pre-exposure prophylaxis (PrEP) to prevent HIV infection.  General information    Stay up to date on vaccinations. Certain vaccines can lower your risk of getting certain STIs, such as:  Hepatitis A and hepatitis B vaccines. You may have been vaccinated as a young child, but you will likely need a booster shot as a teen or young adult.  HPV (human papillomavirus) vaccine.  Have only one sex partner (be monogamous) or limit the number of sex partners you have.  Use methods that prevent the exchange of body fluids between partners (barrier protection) correctly every time you have sex. Barrier protection can be used during oral, vaginal, or anal sex. Commonly used barrier methods include:  Female condom.  Female condom.  Dental dam.  Use a new condom for every sex act from start to finish.  Get tested for STIs. Have your partners get tested, too.  If you test positive for an STI, follow recommendations from your health care provider about treatment and make sure your sex partners are tested and treated.  Birth control   pills, injections, implants, and intrauterine devices (IUDs) do not protect against STIs. To prevent both STIs and pregnancy, always use a condom with another form of birth control.  Some STIs, such as herpes, are spread through skin-to-skin contact. A condom may not protect you from getting such STIs. Avoid all sexual contact if you or your partners have herpes and there is an active flare with open sores.  Where to find more information  Learn more about STIs from:  Centers for Disease Control and Prevention:  More  information about specific STIs: cdc.gov/std  Places to get sexual health counseling and treatment for free or at a low cost: gettested.cdc.gov  U.S. Department of Health and Human Services: www.womenshealth.gov  Summary  Sexually transmitted infections (STIs) can spread through exchanging bodily fluids during sexual contact. Fluids include saliva, semen, blood, vaginal mucus, and urine.  You may have an increased risk for developing an STI if you have unprotected sex. Sex includes oral, vaginal, or anal sex.  If you do have sex, limit your number of sex partners and use barrier protection every time you have sex.  This information is not intended to replace advice given to you by your health care provider. Make sure you discuss any questions you have with your health care provider.  Document Revised: 05/22/2019 Document Reviewed: 05/22/2019  Elsevier Patient Education  2022 [employer] Inc[employer]

## 2021-02-12 NOTE — Progress Notes (Signed)
I,Victoria Hamilton,acting as a Education administrator for Minette Brine, FNP.,have documented all relevant documentation on the behalf of Minette Brine, FNP,as directed by  Minette Brine, FNP while in the presence of Minette Brine, [place].   This visit occurred during the SARS-CoV-2 public health emergency.  Safety protocols were in place, including screening questions prior to the visit, additional usage of staff PPE, and extensive cleaning of exam room while observing appropriate contact time as indicated for disinfecting solutions.  Subjective:     Patient ID: Elizabeth Graham , female    DOB: November 26, 1980 , 40 y.o.   MRN: [id number]   Chief Complaint  Patient presents with   STD Testing      HPI  Pt here for STD screening. She states that "her and her husband are getting a divorce, he had an affair, she wants to get tested for everything."  No current symptoms    Past Medical History:  Diagnosis Date   Obesity    Pituitary microadenoma with hyperprolactinemia ([facility]) 2017   Polygalactia      Family History  Problem Relation Age of Onset   Diabetes Mother    Hypertension Mother    Heart disease Mother      Current Outpatient Medications:    cetirizine (ZYRTEC) 10 MG tablet, Take 1 tablet (10 mg total) by mouth daily., Disp: 90 tablet, Rfl: 1   fluticasone (FLONASE) 50 MCG/ACT nasal spray, Place 2 sprays into both nostrils daily., Disp: , Rfl:    ibuprofen (ADVIL,MOTRIN) 800 MG tablet, Take 1 tablet (800 mg total) by mouth every 8 (eight) hours as needed for headache., Disp: 90 tablet, Rfl: 0   norgestrel-ethinyl estradiol (LO/OVRAL) 0.3-30 MG-MCG tablet, Take 1 tablet by mouth daily., Disp: 90 tablet, Rfl: 3   phentermine 37.5 MG capsule, Take 1 capsule (37.5 mg total) by mouth every morning., Disp: 30 capsule, Rfl: 1   No Known Allergies   Review of Systems  Constitutional: Negative.   Respiratory: Negative.    Cardiovascular: Negative.  Negative for chest pain, palpitations and leg swelling.   Genitourinary: Negative.   Neurological: Negative.   Psychiatric/Behavioral: Negative.      Today's Vitals   02/12/21 0935  BP: 122/88  Pulse: 85  Temp: 98 F (36.7 C)  Weight: 188 lb 6.4 oz (85.5 kg)  Height: 5\' 2"  (1.575 m)  PainSc: 0-No pain   Body mass index is 34.46 kg/m.  Wt Readings from Last 3 Encounters:  02/12/21 188 lb 6.4 oz (85.5 kg)  01/23/21 185 lb (83.9 kg)  11/21/20 192 lb 6.4 oz (87.3 kg)    Objective:  Physical Exam Vitals reviewed.  Constitutional:      Appearance: Normal appearance. She is obese.  Pulmonary:     Effort: Pulmonary effort is normal. No respiratory distress.  Genitourinary:    General: Normal vulva.     Vagina: Normal.     Comments: No vaginal discharge noted Neurological:     General: No focal deficit present.     Mental Status: She is alert and oriented to person, place, and time.     Cranial Nerves: No cranial nerve deficit.     Motor: No weakness.  Psychiatric:        Mood and Affect: Mood normal.        Behavior: Behavior normal.        Thought Content: Thought content normal.        Judgment: Judgment normal.        Assessment And  Plan:     1. Screen for STD (sexually transmitted disease) Comments: I did encourage her to remain safe and if feels unsafe to reach out - T [facility] Screening [facility] - Hepatitis A Antibody, Total - Hepatitis C antibody - HIV Antibody (routine testing w rflx) - Cervicovaginal ancillary only - HSV(herpes simplex vrs) 1+2 ab-IgG - Hepatitis B surface antigen    Patient was given opportunity to ask questions. Patient verbalized understanding of the plan and was able to repeat key elements of the plan. All questions were answered to their satisfaction.  Minette Brine, FNP   I, Minette Brine, FNP, have reviewed all documentation for this visit. The documentation on 02/12/21 for the exam, diagnosis, procedures, and orders are all accurate and complete.   IF YOU HAVE BEEN REFERRED TO A  SPECIALIST, IT MAY TAKE 1-2 WEEKS TO SCHEDULE/PROCESS THE REFERRAL. IF YOU HAVE NOT HEARD FROM US/SPECIALIST IN TWO WEEKS, PLEASE GIVE [place] A CALL AT [phone].   THE PATIENT IS ENCOURAGED TO PRACTICE SOCIAL DISTANCING DUE TO THE COVID-19 PANDEMIC.

## 2021-02-13 LAB — HSV(HERPES SIMPLEX VRS) I + II AB-IGG
HSV 1 Glycoprotein G Ab, IgG: 24.3 index — ABNORMAL HIGH (ref 0.00–0.90)
HSV 2 IgG, Type Spec: <0.91 index (ref 0.00–0.90)

## 2021-02-13 LAB — HEPATITIS A ANTIBODY, TOTAL: hep A Total Ab: NEGATIVE

## 2021-02-13 LAB — HEPATITIS C ANTIBODY: Hep C Virus Ab: <0.1 s/co ratio (ref 0.0–0.9)

## 2021-02-13 LAB — T PALLIDUM SCREENING CASCADE: T pallidum Antibodies (TP-PA): NONREACTIVE

## 2021-02-13 LAB — HIV ANTIBODY (ROUTINE TESTING W REFLEX): HIV Screen 4th Generation wRfx: NONREACTIVE

## 2021-02-13 LAB — HEPATITIS B SURFACE ANTIGEN: Hepatitis B Surface Ag: NEGATIVE

## 2021-02-14 LAB — CERVICOVAGINAL ANCILLARY ONLY
Bacterial Vaginitis (gardnerella): NEGATIVE
Candida Glabrata: NEGATIVE
Candida Vaginitis: NEGATIVE
Chlamydia: NEGATIVE
Comment: NEGATIVE
Comment: NEGATIVE
Comment: NEGATIVE
Comment: NEGATIVE
Comment: NEGATIVE
Comment: NORMAL
Neisseria Gonorrhea: NEGATIVE
Trichomonas: NEGATIVE

## 2021-02-18 ENCOUNTER — Ambulatory Visit: Admitting: Nurse Practitioner

## 2021-03-25 ENCOUNTER — Encounter: Payer: Self-pay | Admitting: Nurse Practitioner

## 2021-03-25 ENCOUNTER — Other Ambulatory Visit: Payer: Self-pay

## 2021-03-25 ENCOUNTER — Ambulatory Visit (INDEPENDENT_AMBULATORY_CARE_PROVIDER_SITE_OTHER): Admitting: Nurse Practitioner

## 2021-03-25 VITALS — BP 124/80 | HR 91 | Temp 99.6°F | Ht 62.6 in | Wt 186.4 lb

## 2021-03-25 DIAGNOSIS — Z6833 Body mass index (BMI) 33.0-33.9, adult: Secondary | ICD-10-CM | POA: Diagnosis not present

## 2021-03-25 DIAGNOSIS — Z6835 Body mass index (BMI) 35.0-35.9, adult: Secondary | ICD-10-CM | POA: Diagnosis not present

## 2021-03-25 DIAGNOSIS — E6609 Other obesity due to excess calories: Secondary | ICD-10-CM

## 2021-03-25 MED ORDER — PHENTERMINE HCL 37.5 MG PO CAPS: 37.5000 mg | ORAL_CAPSULE | ORAL | 1 refills | Status: DC

## 2021-03-25 NOTE — Progress Notes (Signed)
Rich Brave [place],acting as a Education administrator for Minette Brine, FNP.,have documented all relevant documentation on the behalf of Minette Brine, FNP,as directed by  Minette Brine, FNP while in the presence of Minette Brine, [place].  This visit occurred during the SARS-CoV-2 public health emergency.  Safety protocols were in place, including screening questions prior to the visit, additional usage of staff PPE, and extensive cleaning of exam room while observing appropriate contact time as indicated for disinfecting solutions.  Subjective:     Patient ID: Elizabeth Graham , female    DOB: 09/07/1980 , 40 y.o.   MRN: [id number]   Chief Complaint  Patient presents with   Weight Check    HPI  Patient presents today for  weight check. Patient is tolerating phentermine well.  She is currently going through a court case for her divorce. She is not exercising as regular, and some of her diet has fallen off.   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   Polygalactia      Family History  Problem Relation Age of Onset   Diabetes Mother    Hypertension Mother    Heart disease Mother      Current Outpatient Medications:    cetirizine (ZYRTEC) 10 MG tablet, Take 1 tablet (10 mg total) by mouth daily., Disp: 90 tablet, Rfl: 1   fluticasone (FLONASE) 50 MCG/ACT nasal spray, Place 2 sprays into both nostrils daily., Disp: , Rfl:    ibuprofen (ADVIL,MOTRIN) 800 MG tablet, Take 1 tablet (800 mg total) by mouth every 8 (eight) hours as needed for headache., Disp: 90 tablet, Rfl: 0   norgestrel-ethinyl estradiol (LO/OVRAL) 0.3-30 MG-MCG tablet, Take 1 tablet by mouth daily., Disp: 90 tablet, Rfl: 3   phentermine 37.5 MG capsule, Take 1 capsule (37.5 mg total) by mouth every morning., Disp: 30 capsule,  Rfl: 1   No Known Allergies   Review of Systems  Constitutional: Negative.   Respiratory: Negative.    Cardiovascular: Negative.   Neurological: Negative.   Psychiatric/Behavioral: Negative.      Today's Vitals   03/25/21 1119  BP: 124/80  Pulse: 91  Temp: 99.6 F (37.6 C)  Weight: 186 lb 6.4 oz (84.6 kg)  Height: 5' 2.6" (1.59 m)  PainSc: 0-No pain   Body mass index is 33.44 kg/m.   Objective:  Physical Exam Vitals reviewed.  Constitutional:      General: She is not in acute distress.    Appearance: Normal appearance. She is obese.  Cardiovascular:     Rate and Rhythm: Normal rate and regular rhythm.     Pulses: Normal pulses.     Heart sounds: Normal heart sounds. No murmur heard. Neurological:     General: No focal deficit present.     Mental Status: She is alert and oriented to person, place, and time.     Cranial Nerves: No cranial nerve deficit.     Motor: No weakness.  Psychiatric:        Mood and Affect: Mood normal.        Behavior: Behavior normal.        Thought Content: Thought content normal.        Judgment: Judgment normal.        Assessment  And Plan:     1. Class 1 obesity due to excess calories with body mass index (BMI) of 33.0 to 33.9 in adult, unspecified whether serious comorbidity present  Chronic, weight has plateau'ed but thinks could be related to her not exercising and not eating the best while going through her divorce.  Encouraged to really focus on healthy diet as 80% of weight loss is related to diet.  Encouraged to exercise at least 150 minutes per week with 2 days of strength training    Patient was given opportunity to ask questions. Patient verbalized understanding of the plan and was able to repeat key elements of the plan. All questions were answered to their satisfaction.  Minette Brine, FNP   I, Minette Brine, FNP, have reviewed all documentation for this visit. The documentation on 03/25/21 for the exam, diagnosis,  procedures, and orders are all accurate and complete.   IF YOU HAVE BEEN REFERRED TO A SPECIALIST, IT MAY TAKE 1-2 WEEKS TO SCHEDULE/PROCESS THE REFERRAL. IF YOU HAVE NOT HEARD FROM US/SPECIALIST IN TWO WEEKS, PLEASE GIVE [place] A CALL AT [phone].   THE PATIENT IS ENCOURAGED TO PRACTICE SOCIAL DISTANCING DUE TO THE COVID-19 PANDEMIC.

## 2021-03-25 NOTE — Patient Instructions (Signed)

## 2021-05-26 ENCOUNTER — Encounter: Payer: Self-pay | Admitting: Nurse Practitioner

## 2021-05-26 ENCOUNTER — Ambulatory Visit: Admitting: Nurse Practitioner

## 2021-05-26 ENCOUNTER — Other Ambulatory Visit: Payer: Self-pay

## 2021-05-26 VITALS — BP 132/74 | HR 99 | Temp 98.4°F | Ht 62.6 in | Wt 188.2 lb

## 2021-05-26 DIAGNOSIS — Z6833 Body mass index (BMI) 33.0-33.9, adult: Secondary | ICD-10-CM | POA: Diagnosis not present

## 2021-05-26 DIAGNOSIS — E6609 Other obesity due to excess calories: Secondary | ICD-10-CM

## 2021-05-26 MED ORDER — PHENTERMINE HCL 37.5 MG PO CAPS: 37.5000 mg | ORAL_CAPSULE | ORAL | 1 refills | Status: DC

## 2021-05-26 NOTE — Progress Notes (Signed)
I,Tianna Badgett,acting as a Education administrator for [employer], FNP.,have documented all relevant documentation on the behalf of Minette Brine, FNP,as directed by  Minette Brine, FNP while in the presence of Minette Brine, [place].  This visit occurred during the SARS-CoV-2 public health emergency.  Safety protocols were in place, including screening questions prior to the visit, additional usage of staff PPE, and extensive cleaning of exam room while observing appropriate contact time as indicated for disinfecting solutions.  Subjective:     Patient ID: Elizabeth Graham , female    DOB: [birth year] , 41 y.o.   MRN: [id number]   Chief Complaint  Patient presents with   Weight Check    HPI  Here for weight check. She reports she has been taking phentermine daily but admits to not eating right, she had celebrated all month for her birthday. She has started back going to the gym.    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   Polygalactia      Family History  Problem Relation Age of Onset   Diabetes Mother    Hypertension Mother    Heart disease Mother      Current Outpatient Medications:    cetirizine (ZYRTEC) 10 MG tablet, Take 1 tablet (10 mg total) by mouth daily., Disp: 90 tablet, Rfl: 1   fluticasone (FLONASE) 50 MCG/ACT nasal spray, Place 2 sprays into both nostrils daily., Disp: , Rfl:    ibuprofen (ADVIL,MOTRIN) 800 MG tablet, Take 1 tablet (800 mg total) by mouth every 8 (eight) hours as needed for headache., Disp: 90 tablet, Rfl: 0   norgestrel-ethinyl estradiol (LO/OVRAL) 0.3-30 MG-MCG tablet, Take 1 tablet by mouth daily., Disp: 90 tablet, Rfl: 3   phentermine 37.5 MG capsule, Take 1 capsule (37.5 mg total) by mouth every morning., Disp: 30 capsule, Rfl: 1   No Known  Allergies   Review of Systems  Constitutional: Negative.   Respiratory: Negative.    Cardiovascular: Negative.   Neurological: Negative.   Psychiatric/Behavioral: Negative.      Today's Vitals   05/26/21 1137  BP: 132/74  Pulse: 99  Temp: 98.4 F (36.9 C)  TempSrc: Oral  Weight: 188 lb 3.2 oz (85.4 kg)  Height: 5' 2.6" (1.59 m)   Body mass index is 33.77 kg/m.  Wt Readings from Last 3 Encounters:  05/26/21 188 lb 3.2 oz (85.4 kg)  03/25/21 186 lb 6.4 oz (84.6 kg)  02/12/21 188 lb 6.4 oz (85.5 kg)    Objective:  Physical Exam Vitals reviewed.  Constitutional:      General: She is not in acute distress.    Appearance: Normal appearance. She is obese.  Cardiovascular:     Rate and Rhythm: Normal rate and regular rhythm.     Pulses: Normal pulses.     Heart sounds: Normal heart sounds. No murmur heard. Pulmonary:     Effort: Pulmonary effort is normal. No respiratory distress.     Breath sounds: Normal breath sounds. No wheezing.  Neurological:     General: No focal deficit present.     Mental Status: She is alert and oriented to person, place, and time.     Cranial Nerves: No cranial nerve deficit.     Motor: No weakness.  Psychiatric:        Mood and Affect: Mood normal.        Behavior: Behavior normal.        Thought Content: Thought content normal.        Judgment: Judgment normal.        Assessment And Plan:     1. Class 1 obesity due to excess calories with body mass index (BMI) of 33.0 to 33.9 in adult, unspecified whether serious comorbidity present She is encouraged to strive for BMI less than 30 to decrease cardiac risk. Advised to aim for at least 150 minutes of exercise per week. I have encouraged her to get back on track.  Continue phentermine 37.5 mg daily - phentermine 37.5 MG capsule; Take 1 capsule (37.5 mg total) by mouth every morning.  Dispense: 30 capsule; Refill: 1   Patient was given opportunity to ask questions. Patient verbalized  understanding of the plan and was able to repeat key elements of the plan. All questions were answered to their satisfaction.  Minette Brine, FNP   I, Minette Brine, FNP, have reviewed all documentation for this visit. The documentation on 05/26/21 for the exam, diagnosis, procedures, and orders are all accurate and complete.   IF YOU HAVE BEEN REFERRED TO A SPECIALIST, IT MAY TAKE 1-2 WEEKS TO SCHEDULE/PROCESS THE REFERRAL. IF YOU HAVE NOT HEARD FROM US/SPECIALIST IN TWO WEEKS, PLEASE GIVE [place] A CALL AT [phone].   THE PATIENT IS ENCOURAGED TO PRACTICE SOCIAL DISTANCING DUE TO THE COVID-19 PANDEMIC.

## 2021-05-26 NOTE — Patient Instructions (Signed)
Obesity, Adult Obesity is the condition of having too much total body fat. Being overweight or obese means that your weight is greater than what is considered healthy for your body size. Obesity is determined by a measurement called BMI (body mass index). BMI is an estimate of body fat and is calculated from height and weight. For adults, a BMI of 30 or higher is considered obese. Obesity can lead to other health concerns and major illnesses, including: Stroke. Coronary artery disease (CAD). Type 2 diabetes. Some types of cancer, including cancers of the colon, breast, uterus, and gallbladder. High blood pressure (hypertension). High cholesterol. Gallbladder stones. Obesity can also contribute to: Osteoarthritis. Sleep apnea. Infertility problems. What are the causes? Common causes of this condition include: Eating daily meals that are high in calories, sugar, and fat. Drinking high amounts of sugar-sweetened beverages, such as soft drinks. Being born with genes that may make you more likely to become obese. Having a medical condition that causes obesity, including: Hypothyroidism. Polycystic ovarian syndrome (PCOS). Binge-eating disorder. Cushing syndrome. Taking certain medicines, such as steroids, antidepressants, and seizure medicines. Not being physically active (sedentary lifestyle). Not getting enough sleep. What increases the risk? The following factors may make you more likely to develop this condition: Having a family history of obesity. Living in an area with limited access to: [place], recreation centers, or sidewalks. Healthy food choices, such as grocery stores and farmers' markets. What are the signs or symptoms? The main sign of this condition is having too much body fat. How is this diagnosed? This condition is diagnosed based on: Your BMI. If you are an adult with a BMI of 30 or higher, you are considered obese. Your waist circumference. This measures the  distance around your waistline. Your skinfold thickness. Your health care provider may gently pinch a fold of your skin and measure it. You may have other tests to check for underlying conditions. How is this treated? Treatment for this condition often includes changing your lifestyle. Treatment may include some or all of the following: Dietary changes. This may include developing a healthy meal plan. Regular physical activity. This may include activity that causes your heart to beat faster (aerobic exercise) and strength training. Work with your health care provider to design an exercise program that works for you. Medicine to help you lose weight if you are unable to lose one pound a week after six weeks of healthy eating and more physical activity. Treating conditions that cause the obesity (underlying conditions). Surgery. Surgical options may include gastric banding and gastric bypass. Surgery may be done if: Other treatments have not helped to improve your condition. You have a BMI of 40 or higher. You have life-threatening health problems related to obesity. Follow these instructions at home: Eating and drinking  Follow recommendations from your health care provider about what you eat and drink. Your health care provider may advise you to: Limit fast food, sweets, and processed snack foods. Choose low-fat options, such as low-fat milk instead of whole milk. Eat five or more servings of fruits or vegetables every day. Choose healthy foods when you eat out. Keep low-fat snacks available. Limit sugary drinks, such as soda, fruit juice, sweetened iced tea, and flavored milk. Drink enough water to keep your urine pale yellow. Do not follow a fad diet. Fad diets can be unhealthy and even dangerous. Other healthful choices include: Eat at home more often. This gives you more control over what you eat. Learn to read food labels.  This will help you understand how much food is considered one  serving. Learn what a healthy serving size is. Physical activity Exercise regularly, as told by your health care provider. Most adults should get up to 150 minutes of moderate-intensity exercise every week. Ask your health care provider what types of exercise are safe for you and how often you should exercise. Warm up and stretch before being active. Cool down and stretch after being active. Rest between periods of activity. Lifestyle Work with your health care provider and a dietitian to set a weight-loss goal that is healthy and reasonable for you. Limit your screen time. Find ways to reward yourself that do not involve food. Do not drink alcohol if: Your health care provider tells you not to drink. You are pregnant, may be pregnant, or are planning to become pregnant. If you drink alcohol: Limit how much you have to: 0-1 drink a day for women. 0-2 drinks a day for men. Know how much alcohol is in your drink. In the [place] one drink equals one 12 oz bottle of beer (355 mL), one 5 oz glass of wine (148 mL), or one 1 oz glass of hard liquor (44 mL). General instructions Keep a weight-loss journal to keep track of the food you eat and how much exercise you get. Take over-the-counter and prescription medicines only as told by your health care provider. Take vitamins and supplements only as told by your health care provider. Consider joining a support group. Your health care provider may be able to recommend a support group. Pay attention to your mental health as obesity can lead to depression or self esteem issues. Keep all follow-up visits. This is important. Contact a health care provider if: You are unable to meet your weight-loss goal after six weeks of dietary and lifestyle changes. You have trouble breathing. Summary Obesity is the condition of having too much total body fat. Being overweight or obese means that your weight is greater than what is considered healthy for your body  size. Work with your health care provider and a dietitian to set a weight-loss goal that is healthy and reasonable for you. Exercise regularly, as told by your health care provider. Ask your health care provider what types of exercise are safe for you and how often you should exercise. This information is not intended to replace advice given to you by your health care provider. Make sure you discuss any questions you have with your health care provider. Document Revised: 11/12/2020 Document Reviewed: 11/12/2020 Elsevier Patient Education  [place].

## 2021-06-09 ENCOUNTER — Other Ambulatory Visit: Payer: Self-pay

## 2021-06-09 DIAGNOSIS — E6609 Other obesity due to excess calories: Secondary | ICD-10-CM

## 2021-07-16 ENCOUNTER — Other Ambulatory Visit: Payer: Self-pay

## 2021-07-16 ENCOUNTER — Encounter: Payer: Self-pay | Admitting: Nurse Practitioner

## 2021-07-16 ENCOUNTER — Ambulatory Visit (INDEPENDENT_AMBULATORY_CARE_PROVIDER_SITE_OTHER): Admitting: Nurse Practitioner

## 2021-07-16 VITALS — BP 120/60 | HR 93 | Temp 98.5°F | Ht 62.4 in | Wt 194.0 lb

## 2021-07-16 DIAGNOSIS — E6609 Other obesity due to excess calories: Secondary | ICD-10-CM | POA: Diagnosis not present

## 2021-07-16 DIAGNOSIS — Z0000 Encounter for general adult medical examination without abnormal findings: Secondary | ICD-10-CM

## 2021-07-16 DIAGNOSIS — Z308 Encounter for other contraceptive management: Secondary | ICD-10-CM

## 2021-07-16 DIAGNOSIS — Z6835 Body mass index (BMI) 35.0-35.9, adult: Secondary | ICD-10-CM | POA: Diagnosis not present

## 2021-07-16 DIAGNOSIS — Z3041 Encounter for surveillance of contraceptive pills: Secondary | ICD-10-CM | POA: Diagnosis not present

## 2021-07-16 DIAGNOSIS — K5904 Chronic idiopathic constipation: Secondary | ICD-10-CM

## 2021-07-16 DIAGNOSIS — Z13228 Encounter for screening for other metabolic disorders: Secondary | ICD-10-CM

## 2021-07-16 MED ORDER — NORGESTREL-ETHINYL ESTRADIOL 0.3-30 MG-MCG PO TABS: 1.0000 | ORAL_TABLET | Freq: Every day | ORAL | 3 refills | Status: DC

## 2021-07-16 MED ORDER — PHENTERMINE HCL 37.5 MG PO CAPS: 37.5000 mg | ORAL_CAPSULE | ORAL | 1 refills | Status: DC

## 2021-07-16 NOTE — Progress Notes (Signed)
?Industrial/product designer as a Education administrator for [employer], FNP.,have documented all relevant documentation on the behalf of Minette Brine, FNP,as directed by  Minette Brine, FNP while in the presence of Minette Brine, [place]. ? ?This visit occurred during the SARS-CoV-2 public health emergency.  Safety protocols were in place, including screening questions prior to the visit, additional usage of staff PPE, and extensive cleaning of exam room while observing appropriate contact time as indicated for disinfecting solutions. ? ?Subjective:  ?  ? Patient ID: Elizabeth Graham , female    DOB: May 19, 1980 , 41 y.o.   MRN: [id number] ? ? ?Chief Complaint  ?Patient presents with  ? Annual Exam  ? ? ?HPI ? ?Here for HM.  She has her PAPs done here next due in 06/2022 ?  ? ?Past Medical History:  ?Diagnosis Date  ? Obesity   ? 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 Polygalactia   ?  ? ?Family History  ?Problem Relation Age of Onset  ? Diabetes Mother   ? Hypertension Mother   ? Heart disease Mother   ? ? ? ?Current Outpatient Medications:  ?  cetirizine (ZYRTEC) 10 MG tablet, Take 1 tablet (10 mg total) by mouth daily., Disp: 90 tablet, Rfl: 1 ?  fluticasone (FLONASE) 50 MCG/ACT nasal spray, Place 2 sprays into both nostrils daily., Disp: , Rfl:  ?  ibuprofen (ADVIL,MOTRIN) 800 MG tablet, Take 1 tablet (800 mg total) by mouth every 8 (eight) hours as needed for headache., Disp: 90 tablet, Rfl: 0 ?  norgestrel-ethinyl estradiol (LO/OVRAL) 0.3-30 MG-MCG tablet, Take 1 tablet by mouth daily., Disp: 90 tablet, Rfl: 3 ?  phentermine 37.5 MG capsule, Take 1 capsule (37.5 mg total) by mouth every morning., Disp: 30 capsule, Rfl: 1  ? ?No Known Allergies  ? ? ?The patient states she uses OCP (estrogen/progesterone) for birth control.  Patient's last menstrual period was 07/13/2021.. Negative for Dysmenorrhea and Negative for Menorrhagia. Negative for: breast discharge, breast lump(s), breast pain and breast self exam.  Associated symptoms include abnormal vaginal bleeding. Pertinent negatives include abnormal bleeding (hematology), anxiety, decreased libido, depression, difficulty falling sleep, dyspareunia, history of infertility, nocturia, sexual dysfunction, sleep disturbances, urinary incontinence, urinary urgency, vaginal discharge and vaginal itching. Diet regular; has been drinking less water.The patient states her exercise level is minimal in the last 3 weeks has been caring for her grandmother and mother (her mother has been in nursing home since 2019). ? ?The patient's tobacco use is:  ?Social History  ? ?Tobacco Use  ?Smoking Status Never  ?Smokeless Tobacco Never  ? ?She has been exposed to passive smoke. The patient's alcohol use is:  ?Social History  ? ?Substance and Sexual Activity  ?Alcohol Use Yes  ? Comment: SOCIALLY  ? ?Additional information: Last pap 06/2019, next one scheduled for 06/2022.   ? ?Review of Systems  ?Constitutional: Negative.   ?HENT: Negative.    ?Eyes: Negative.   ?Respiratory: Negative.    ?Cardiovascular: Negative.   ?Gastrointestinal: Negative.   ?Endocrine: Negative.   ?Genitourinary: Negative.   ?Musculoskeletal: Negative.   ?Skin: Negative.   ?Allergic/Immunologic: Negative.   ?Neurological: Negative.   ?Hematological: Negative.   ?Psychiatric/Behavioral: Negative.     ? ?Today's Vitals  ? 07/16/21 1022  ?BP: 120/60  ?Pulse: 93  ?Temp: 98.5 ?F (36.9 ?C)  ?TempSrc: Oral  ?Weight: 194 lb (88 kg)  ?Height: 5' 2.4" (1.585 m)  ? ?Body mass index is 35.03 kg/m?.  ?Wt Readings from Last 3 Encounters:  ?  07/16/21 194 lb (88 kg)  ?05/26/21 188 lb 3.2 oz (85.4 kg)  ?03/25/21 186 lb 6.4 oz (84.6 kg)  ? ?Body mass index is 35.03 kg/m?. ? ?Objective:  ?Physical Exam ?Vitals reviewed.  ?Constitutional:   ?   General: She is not in acute distress. ?   Appearance: Normal appearance. She is well-developed. She is obese.  ?HENT:  ?   Head: Normocephalic and atraumatic.  ?   Right Ear: Hearing, tympanic  membrane, ear canal and external ear normal. There is no impacted cerumen.  ?   Left Ear: Hearing, tympanic membrane, ear canal and external ear normal. There is no impacted cerumen.  ?   Nose:  ?   Comments: Deferred - masked ?   Mouth/Throat:  ?   Comments: Deferred - masked ?Eyes:  ?   General: Lids are normal.  ?   Extraocular Movements: Extraocular movements intact.  ?   Conjunctiva/sclera: Conjunctivae normal.  ?   Pupils: Pupils are equal, round, and reactive to light.  ?   Funduscopic exam: ?   Right eye: No papilledema.     ?   Left eye: No papilledema.  ?Neck:  ?   Thyroid: No thyroid mass.  ?   Vascular: No carotid bruit.  ?Cardiovascular:  ?   Rate and Rhythm: Normal rate and regular rhythm.  ?   Pulses: Normal pulses.  ?   Heart sounds: Normal heart sounds. No murmur heard. ?Pulmonary:  ?   Effort: Pulmonary effort is normal. No respiratory distress.  ?   Breath sounds: Normal breath sounds. No wheezing.  ?Chest:  ?   Chest wall: No mass.  ?Breasts: ?   Tanner Score is 5.  ?   Right: Normal. No mass or tenderness.  ?   Left: Normal. No mass or tenderness.  ?Abdominal:  ?   General: Abdomen is flat. Bowel sounds are normal. There is no distension.  ?   Palpations: Abdomen is soft.  ?   Tenderness: There is no abdominal tenderness.  ?Genitourinary: ?   Comments: Deferred  ?Musculoskeletal:     ?   General: No swelling. Normal range of motion.  ?   Cervical back: Full passive range of motion without pain, normal range of motion and neck supple.  ?   Right lower leg: No edema.  ?   Left lower leg: No edema.  ?Lymphadenopathy:  ?   Upper Body:  ?   Right upper body: No supraclavicular, axillary or pectoral adenopathy.  ?   Left upper body: No supraclavicular, axillary or pectoral adenopathy.  ?Skin: ?   General: Skin is warm and dry.  ?   Capillary Refill: Capillary refill takes less than 2 seconds.  ?Neurological:  ?   General: No focal deficit present.  ?   Mental Status: She is alert and oriented to  person, place, and time.  ?   Cranial Nerves: No cranial nerve deficit.  ?   Sensory: No sensory deficit.  ?   Motor: No weakness.  ?Psychiatric:     ?   Mood and Affect: Mood normal.     ?   Behavior: Behavior normal.     ?   Thought Content: Thought content normal.     ?   Judgment: Judgment normal.  ?  ? ?   ?Assessment And Plan:  ?   ?1. Encounter for general adult medical examination w/o abnormal findings ?Behavior modifications discussed and diet history reviewed.   ?  Pt will continue to exercise regularly and modify diet with low GI, plant based foods and decrease intake of processed foods.  ?Recommend intake of daily multivitamin, Vitamin D, and calcium.  ?Recommend mammogram for preventive screenings, as well as recommend immunizations that include influenza, TDAP ?- CBC ?- CMP14+EGFR ?- Lipid panel ? ?2. Class 2 obesity due to excess calories without serious comorbidity with body mass index (BMI) of 35.0 to 35.9 in adult ?Chronic ?Discussed healthy diet and regular exercise options  ?Encouraged to exercise at least 150 minutes per week with 2 days of strength training ?She is encouraged to strive for BMI less than 30 to decrease cardiac risk.  ?- phentermine 37.5 MG capsule; Take 1 capsule (37.5 mg total) by mouth every morning.  Dispense: 30 capsule; Refill: 1 ? ?3. Encounter for surveillance of contraceptive pills ?- norgestrel-ethinyl estradiol (LO/OVRAL) 0.3-30 MG-MCG tablet; Take 1 tablet by mouth daily.  Dispense: 90 tablet; Refill: 3 ? ?4. Encounter for screening for metabolic disorder ?- Hemoglobin A1c ? ?5. Chronic idiopathic constipation ?Comments: Encouraged to increase fiber intake or take a fiber supplement. Stay well hydrated with water ? ? ? ?Patient was given opportunity to ask questions. Patient verbalized understanding of the plan and was able to repeat key elements of the plan. All questions were answered to their satisfaction.  ? ?Minette Brine, FNP  ? ?I, Minette Brine, FNP, have reviewed  all documentation for this visit. The documentation on 07/16/21 for the exam, diagnosis, procedures, and orders are all accurate and complete.  ? ?THE PATIENT IS ENCOURAGED TO PRACTICE SOCIAL DISTANCING DUE TO THE COV

## 2021-07-16 NOTE — Patient Instructions (Signed)

## 2021-09-18 ENCOUNTER — Encounter: Payer: Self-pay | Admitting: Nurse Practitioner

## 2021-09-18 ENCOUNTER — Ambulatory Visit: Admitting: Nurse Practitioner

## 2021-09-18 VITALS — BP 128/68 | HR 90 | Temp 97.9°F | Ht 62.4 in | Wt 196.0 lb

## 2021-09-18 DIAGNOSIS — E6609 Other obesity due to excess calories: Secondary | ICD-10-CM | POA: Diagnosis not present

## 2021-09-18 DIAGNOSIS — E66812 Obesity, class 2: Secondary | ICD-10-CM

## 2021-09-18 DIAGNOSIS — Z6835 Body mass index (BMI) 35.0-35.9, adult: Secondary | ICD-10-CM

## 2021-09-18 MED ORDER — QSYMIA 3.75-23 MG PO CP24: 1.0000 | ORAL_CAPSULE | Freq: Every day | ORAL | 0 refills | Status: DC

## 2021-09-18 NOTE — Progress Notes (Signed)
I,Tianna Badgett,acting as a Education administrator for [employer], FNP.,have documented all relevant documentation on the behalf of Minette Brine, FNP,as directed by  Minette Brine, FNP while in the presence of Minette Brine, [place].  This visit occurred during the SARS-CoV-2 public health emergency.  Safety protocols were in place, including screening questions prior to the visit, additional usage of staff PPE, and extensive cleaning of exam room while observing appropriate contact time as indicated for disinfecting solutions.  Subjective:     Patient ID: Elizabeth Graham , female    DOB: January 20, 1981 , 41 y.o.   MRN: [id number]   Chief Complaint  Patient presents with   Weight Check    HPI  Here for weight check. She reports she has been taking phentermine daily. She is exercising Mon-Fri. Diet remains good. She has noticed her waist has decreased in size  Wt Readings from Last 3 Encounters: 09/18/21 : 196 lb (88.9 kg) 07/16/21 : 194 lb (88 kg) 05/26/21 : 188 lb 3.2 oz (85.4 kg)  She has went from a size 18 in February 2022 now down to size 10.      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   Polygalactia      Family History  Problem Relation Age of Onset   Diabetes Mother    Hypertension Mother    Heart disease Mother      Current Outpatient Medications:    Phentermine-Topiramate (QSYMIA) 3.75-23 MG CP24, Take 1 tablet by mouth daily., Disp: 14 capsule, Rfl: 0   cetirizine (ZYRTEC) 10 MG tablet, Take 1 tablet (10 mg total) by mouth daily., Disp: 90 tablet, Rfl: 1   fluticasone (FLONASE) 50 MCG/ACT nasal spray, Place 2 sprays into both nostrils daily., Disp: , Rfl:    ibuprofen (ADVIL,MOTRIN) 800 MG tablet, Take 1 tablet (800 mg total) by mouth every 8 (eight) hours as needed for headache., Disp: 90 tablet, Rfl: 0   norgestrel-ethinyl estradiol (LO/OVRAL) 0.3-30 MG-MCG tablet, Take 1 tablet by mouth daily., Disp: 90 tablet,  Rfl: 3   phentermine 37.5 MG capsule, Take 1 capsule (37.5 mg total) by mouth every morning., Disp: 30 capsule, Rfl: 1   No Known Allergies   Review of Systems  Constitutional: Negative.   Respiratory: Negative.    Cardiovascular: Negative.   Gastrointestinal: Negative.   Neurological: Negative.      Today's Vitals   09/18/21 1146  BP: 128/68  Pulse: 90  Temp: 97.9 F (36.6 C)  TempSrc: Oral  Weight: 196 lb (88.9 kg)  Height: 5' 2.4" (1.585 m)   Body mass index is 35.39 kg/m.  Wt Readings from Last 3 Encounters:  09/18/21 196 lb (88.9 kg)  07/16/21 194 lb (88 kg)  05/26/21 188 lb 3.2 oz (85.4 kg)    Objective:  Physical Exam Vitals reviewed.  Constitutional:      General: She is not in acute distress.    Appearance: Normal appearance. She is obese.  Cardiovascular:     Rate and Rhythm: Normal rate and regular rhythm.     Pulses: Normal pulses.     Heart sounds: Normal heart sounds. No murmur heard. Pulmonary:     Effort: Pulmonary effort is normal. No respiratory distress.     Breath sounds: Normal breath sounds. No wheezing.  Neurological:     General: No focal deficit present.     Mental Status: She is alert and oriented to person, place, and time.  Cranial Nerves: No cranial nerve deficit.     Motor: No weakness.  Psychiatric:        Mood and Affect: Mood normal.        Behavior: Behavior normal.        Thought Content: Thought content normal.        Judgment: Judgment normal.         Assessment And Plan:     1. Class 2 obesity due to excess calories without serious comorbidity with body mass index (BMI) of 35.0 to 35.9 in adult  Comments: We will try to get her approved for Qsymia. She has plateaued and has been on phentermine alone for more than 6 months will try her on Qsymia She is encouraged to strive for BMI less than 30 to decrease cardiac risk. Advised to aim for at least 150 minutes of exercise per week. - Phentermine-Topiramate (QSYMIA)  3.75-23 MG CP24; Take 1 tablet by mouth daily.  Dispense: 14 capsule; Refill: 0     Patient was given opportunity to ask questions. Patient verbalized understanding of the plan and was able to repeat key elements of the plan. All questions were answered to their satisfaction.  Minette Brine, FNP   I, Minette Brine, FNP, have reviewed all documentation for this visit. The documentation on 09/18/21 for the exam, diagnosis, procedures, and orders are all accurate and complete.   IF YOU HAVE BEEN REFERRED TO A SPECIALIST, IT MAY TAKE 1-2 WEEKS TO SCHEDULE/PROCESS THE REFERRAL. IF YOU HAVE NOT HEARD FROM US/SPECIALIST IN TWO WEEKS, PLEASE GIVE [place] A CALL AT [phone].   THE PATIENT IS ENCOURAGED TO PRACTICE SOCIAL DISTANCING DUE TO THE COVID-19 PANDEMIC.

## 2021-09-18 NOTE — Patient Instructions (Signed)
Obesity, Adult ?Obesity is having too much body fat. Being obese means that your weight is more than what is healthy for you.  ?BMI (body mass index) is a number that explains how much body fat you have. If you have a BMI of 30 or more, you are obese. ?Obesity can cause serious health problems, such as: ?Stroke. ?Coronary artery disease (CAD). ?Type 2 diabetes. ?Some types of cancer. ?High blood pressure (hypertension). ?High cholesterol. ?Gallbladder stones. ?Obesity can also contribute to: ?Osteoarthritis. ?Sleep apnea. ?Infertility problems. ?What are the causes? ?Eating meals each day that are high in calories, sugar, and fat. ?Drinking a lot of drinks that have sugar in them. ?Being born with genes that may make you more likely to become obese. ?Having a medical condition that causes obesity. ?Taking certain medicines. ?Sitting a lot (having a sedentary lifestyle). ?Not getting enough sleep. ?What increases the risk? ?Having a family history of obesity. ?Living in an area with limited access to: ?Parks, recreation centers, or sidewalks. ?Healthy food choices, such as grocery stores and farmers' markets. ?What are the signs or symptoms? ?The main sign is having too much body fat. ?How is this treated? ?Treatment for this condition often includes changing your lifestyle. Treatment may include: ?Changing your diet. This may include making a healthy meal plan. ?Exercise. This may include activity that causes your heart to beat faster (aerobic exercise) and strength training. Work with your doctor to design a program that works for you. ?Medicine to help you lose weight. This may be used if you are not able to lose one pound a week after 6 weeks of healthy eating and more exercise. ?Treating conditions that cause the obesity. ?Surgery. Options may include gastric banding and gastric bypass. This may be done if: ?Other treatments have not helped to improve your condition. ?You have a BMI of 40 or higher. ?You have  life-threatening health problems related to obesity. ?Follow these instructions at home: ?Eating and drinking ? ?Follow advice from your doctor about what to eat and drink. Your doctor may tell you to: ?Limit fast food, sweets, and processed snack foods. ?Choose low-fat options. For example, choose low-fat milk instead of whole milk. ?Eat five or more servings of fruits or vegetables each day. ?Eat at home more often. This gives you more control over what you eat. ?Choose healthy foods when you eat out. ?Learn to read food labels. This will help you learn how much food is in one serving. ?Keep low-fat snacks available. ?Avoid drinks that have a lot of sugar in them. These include soda, fruit juice, iced tea with sugar, and flavored milk. ?Drink enough water to keep your pee (urine) pale yellow. ?Do not go on fad diets. ?Physical activity ?Exercise often, as told by your doctor. Most adults should get up to 150 minutes of moderate-intensity exercise every week.Ask your doctor: ?What types of exercise are safe for you. ?How often you should exercise. ?Warm up and stretch before being active. ?Do slow stretching after being active (cool down). ?Rest between times of being active. ?Lifestyle ?Work with your doctor and a food expert (dietitian) to set a weight-loss goal that is best for you. ?Limit your screen time. ?Find ways to reward yourself that do not involve food. ?Do not drink alcohol if: ?Your doctor tells you not to drink. ?You are pregnant, may be pregnant, or are planning to become pregnant. ?If you drink alcohol: ?Limit how much you have to: ?0-1 drink a day for women. ?0-2 drinks   a day for men. ?Know how much alcohol is in your drink. In the [place] one drink equals one 12 oz bottle of beer (355 mL), one 5 oz glass of wine (148 mL), or one 1? oz glass of hard liquor (44 mL). ?General instructions ?Keep a weight-loss journal. This can help you keep track of: ?The food that you eat. ?How much exercise you  get. ?Take over-the-counter and prescription medicines only as told by your doctor. ?Take vitamins and supplements only as told by your doctor. ?Think about joining a support group. ?Pay attention to your mental health as obesity can lead to depression or self esteem issues. ?Keep all follow-up visits. ?Contact a doctor if: ?You cannot meet your weight-loss goal after you have changed your diet and lifestyle for 6 weeks. ?You are having trouble breathing. ?Summary ?Obesity is having too much body fat. ?Being obese means that your weight is more than what is healthy for you. ?Work with your doctor to set a weight-loss goal. ?Get regular exercise as told by your doctor. ?This information is not intended to replace advice given to you by your health care provider. Make sure you discuss any questions you have with your health care provider. ?Document Revised: 11/12/2020 Document Reviewed: 11/12/2020 ?Elsevier Patient Education ? 2023 Elsevier Inc. ? ?

## 2021-09-29 ENCOUNTER — Other Ambulatory Visit: Payer: Self-pay

## 2021-10-07 ENCOUNTER — Other Ambulatory Visit: Payer: Self-pay | Admitting: Nurse Practitioner

## 2021-10-07 DIAGNOSIS — E6609 Other obesity due to excess calories: Secondary | ICD-10-CM

## 2021-10-13 ENCOUNTER — Other Ambulatory Visit: Payer: Self-pay | Admitting: Nurse Practitioner

## 2021-10-13 DIAGNOSIS — E6609 Other obesity due to excess calories: Secondary | ICD-10-CM

## 2021-10-13 MED ORDER — QSYMIA 3.75-23 MG PO CP24: 1.0000 | ORAL_CAPSULE | Freq: Every day | ORAL | 0 refills | Status: DC

## 2021-10-13 MED ORDER — QSYMIA 7.5-46 MG PO CP24: 1.0000 | ORAL_CAPSULE | Freq: Every day | ORAL | 0 refills | Status: DC

## 2021-10-13 MED ORDER — PHENTERMINE HCL 37.5 MG PO CAPS: 37.5000 mg | ORAL_CAPSULE | ORAL | 1 refills | Status: DC

## 2021-11-06 IMAGING — CT CT HEART MORP W/ CTA COR W/ SCORE W/ CA W/CM &/OR W/O CM
1 of 2 series · 3 of 10 positions shown, 4 images · non-contrast
Comparison: None.
COMPARISON: None.

Addendum:
EXAM:
OVER-READ INTERPRETATION  CT CHEST

The following report is an over-read performed by radiologist Dr.
Sukeina Comme Ca [REDACTED] on 07/26/2020. This
over-read does not include interpretation of cardiac or coronary
anatomy or pathology. The coronary calcium score/coronary CTA
interpretation by the cardiologist is attached.
CLINICAL DATA: 40 Year old African American Female
Cardiac/Coronary  CTA
TECHNIQUE: The patient was scanned on a [employer] Force scanner.

[Series 927: — · 3 of 5 slices shown, 4 images]
[im 2/5  vessel]
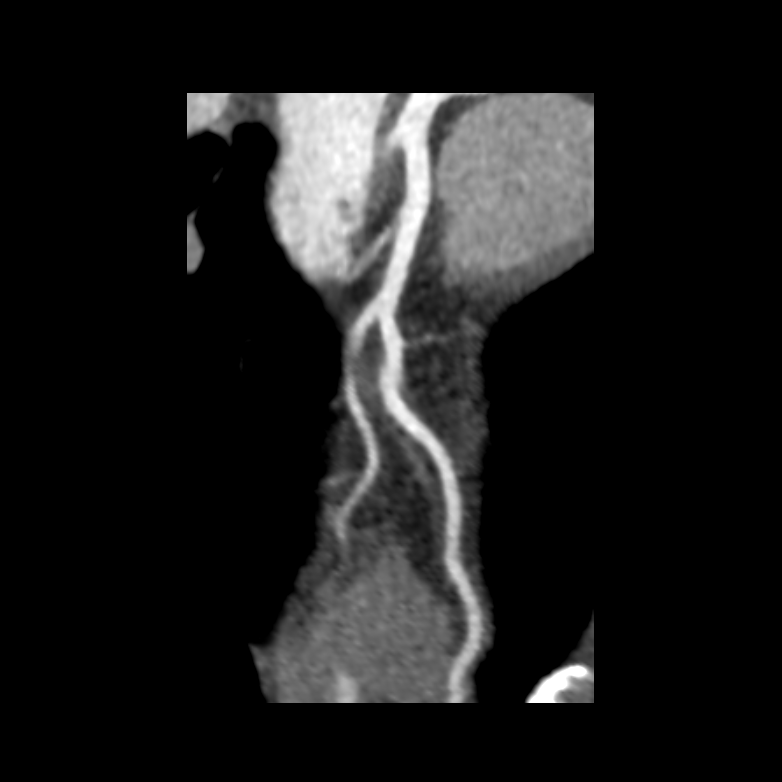
[im 2/5  lung]
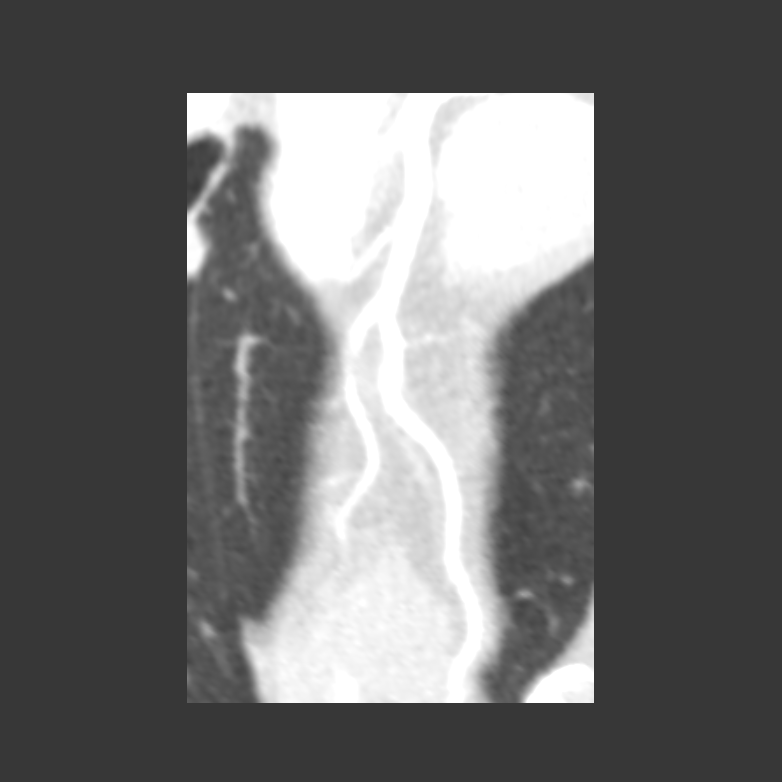
[im 3/5  vessel]
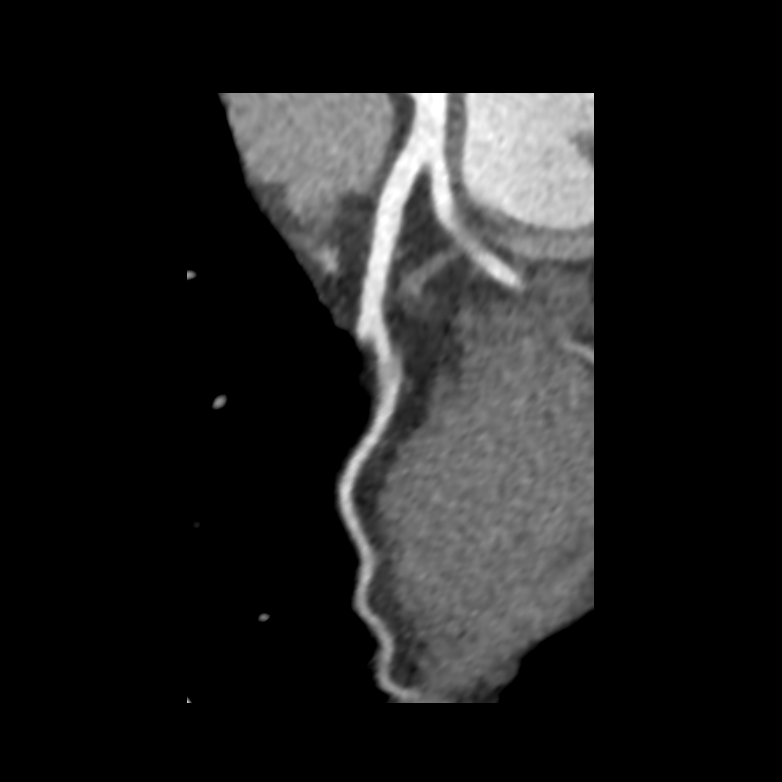
[im 4/5  vessel]
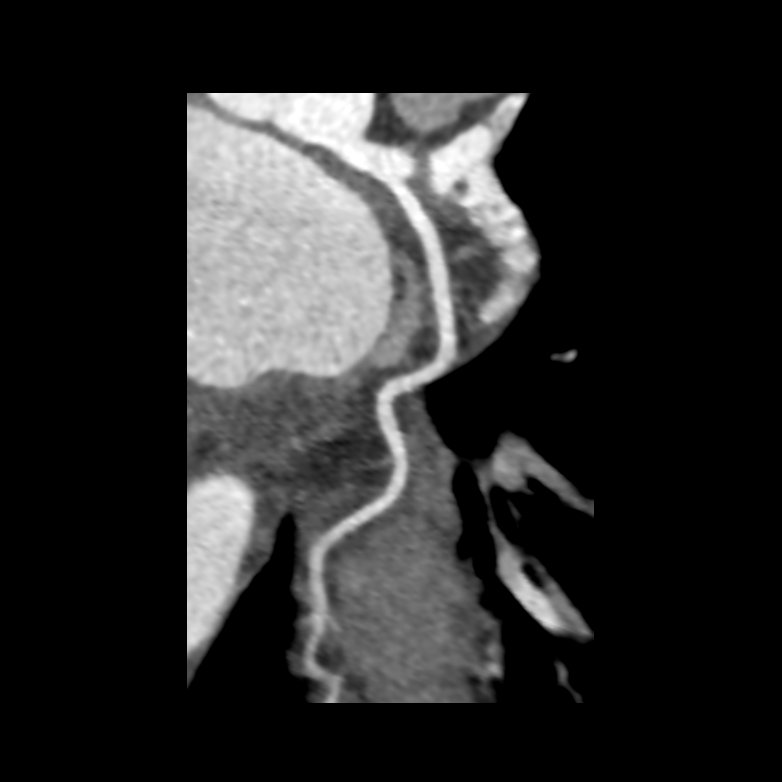

[3 of 10 positions shown; findings below may reference images not displayed]

FINDINGS: Within the visualized portions of the thorax there are no suspicious
appearing pulmonary nodules or masses, there is no acute
consolidative airspace disease, no pleural effusions, no
pneumothorax and no lymphadenopathy. Visualized portions of the
upper abdomen are unremarkable. There are no aggressive appearing
lytic or blastic lesions noted in the visualized portions of the
skeleton.
IMPRESSION: 1. No significant incidental noncardiac findings are noted.
FINDINGS: A 100 kV prospective scan was triggered in the descending thoracic
aorta at 111 HU's. Axial non-contrast 3 mm slices were carried out
through the heart. The data set was analyzed on a dedicated work
station and scored using the Agatson method. Gantry rotation speed
was 250 msecs and collimation was .6 mm. No beta blockade and 0.8 mg
of sl NTG was given. The 3D data set was reconstructed in 5%
intervals of the 67-82 % of the R-R cycle. Diastolic phases were
analyzed on a dedicated work station using MPR, MIP and VRT modes.
The patient received 80 cc of contrast.

Aorta:  Normal size.  No calcifications.  No dissection.

Aortic Valve:  Tri-leaflet.  No calcifications.

Coronary Arteries:  Normal coronary origin.  Left dominance.

Coronary Calcium Score:

Left main: 0

Left anterior descending artery: 0

Left circumflex artery: 0

Right coronary artery: 0

Total: 0

Percentile: 1st for age, sex, and race matched control.

RCA is a small non-dominant artery that gives rise to PDA and PLA.
There is no plaque.

Left main is a large artery that gives rise to LAD and LCX arteries.
There is no plaque.

LAD is a large vessel that gives rise to one large D1 Branch. There
is no plaque

LCX is a large artery that gives rise to one large OM1 branch and a
smaller OM2 vessel. There is no plaque.

Other findings:

Atypical pulmonary vein drainage into the left atrium: Presence of
right middle pulmonary vein.

Normal left atrial appendage without a thrombus.

Normal size of the pulmonary artery.

Extra-cardiac findings: See attached radiology report for
non-cardiac structures.
IMPRESSION: 1. Coronary calcium score of 0. This was 1st percentile for age,
sex, and race matched control.

2. Normal coronary origin with left dominance.

3. CAD-RADS 0. No evidence of CAD (0%). Consider non-atherosclerotic
causes of chest pain.

4. Atypical pulmonary vein drainage into the left atrium: Presence
of right middle pulmonary vein.

RECOMMENDATIONS:

Coronary artery calcium (CAC) score is a strong predictor of
incident coronary heart disease (CHD) and provides predictive
information beyond traditional risk factors. CAC scoring is
reasonable to use in the decision to withhold, postpone, or initiate
statin therapy in intermediate-risk or selected borderline-risk
asymptomatic adults (age 40-75 years and LDL-C ?70 to <190 mg/dL)
who do not have diabetes or established atherosclerotic
cardiovascular disease (ASCVD).* In intermediate-risk (10-year ASCVD
risk ?7.5% to <20%) adults or selected borderline-risk (10-year
ASCVD risk ?5% to <7.5%) adults in whom a CAC score is measured for
the purpose of making a treatment decision the following
recommendations have been made:

If CAC = 0, it is reasonable to withhold statin therapy and reassess
in 5 to 10 years, as long as higher risk conditions are absent
(diabetes mellitus, family history of premature CHD in first degree
relatives (males <55 years; females <65 years), cigarette smoking,
LDL ?190 mg/dL or other independent risk factors).

If CAC is 1 to 99, it is reasonable to initiate statin therapy for
patients ?55 years of age.

If CAC is ?100 or ?75th percentile, it is reasonable to initiate
statin therapy at any age.

Cardiology referral should be considered for patients with CAC
scores ?400 or ?75th percentile.

*6007 AHA/ACC/AACVPR/AAPA/ABC/ASETIN/ELIZET/KINARD/[place]/IVAN ZVONIMIR/OWADA/BETHELHEM
Guideline on the Management of Blood Cholesterol: A Report of the
American College of Cardiology/American Heart Association Task Force
on Clinical Practice Guidelines. J Am Coll Cardiol.
2806;73(24):1300-1616.

*** End of Addendum ***
EXAM:
OVER-READ INTERPRETATION  CT CHEST

The following report is an over-read performed by radiologist Dr.
Sukeina Comme Ca [REDACTED] on 07/26/2020. This
over-read does not include interpretation of cardiac or coronary
anatomy or pathology. The coronary calcium score/coronary CTA
interpretation by the cardiologist is attached.
FINDINGS: Within the visualized portions of the thorax there are no suspicious
appearing pulmonary nodules or masses, there is no acute
consolidative airspace disease, no pleural effusions, no
pneumothorax and no lymphadenopathy. Visualized portions of the
upper abdomen are unremarkable. There are no aggressive appearing
lytic or blastic lesions noted in the visualized portions of the
skeleton.
IMPRESSION: 1. No significant incidental noncardiac findings are noted.

## 2021-11-19 ENCOUNTER — Ambulatory Visit: Admitting: Nurse Practitioner

## 2021-11-20 ENCOUNTER — Encounter: Payer: Self-pay | Admitting: Nurse Practitioner

## 2021-11-24 ENCOUNTER — Other Ambulatory Visit: Payer: Self-pay

## 2021-11-24 DIAGNOSIS — Z3041 Encounter for surveillance of contraceptive pills: Secondary | ICD-10-CM

## 2021-11-24 MED ORDER — NORGESTREL-ETHINYL ESTRADIOL 0.3-30 MG-MCG PO TABS: 1.0000 | ORAL_TABLET | Freq: Every day | ORAL | 3 refills | Status: DC

## 2021-12-18 ENCOUNTER — Other Ambulatory Visit: Payer: Self-pay | Admitting: Nurse Practitioner

## 2021-12-18 DIAGNOSIS — E6609 Other obesity due to excess calories: Secondary | ICD-10-CM

## 2021-12-18 MED ORDER — PHENTERMINE HCL 37.5 MG PO CAPS: 37.5000 mg | ORAL_CAPSULE | ORAL | 0 refills | Status: DC

## 2021-12-23 ENCOUNTER — Encounter: Payer: Self-pay | Admitting: Nurse Practitioner

## 2021-12-23 ENCOUNTER — Ambulatory Visit (INDEPENDENT_AMBULATORY_CARE_PROVIDER_SITE_OTHER): Admitting: Nurse Practitioner

## 2021-12-23 VITALS — BP 140/68 | HR 93 | Ht 62.0 in | Wt 201.0 lb

## 2021-12-23 DIAGNOSIS — D352 Benign neoplasm of pituitary gland: Secondary | ICD-10-CM

## 2021-12-23 DIAGNOSIS — E6609 Other obesity due to excess calories: Secondary | ICD-10-CM

## 2021-12-23 DIAGNOSIS — K5904 Chronic idiopathic constipation: Secondary | ICD-10-CM | POA: Diagnosis not present

## 2021-12-23 DIAGNOSIS — Z6835 Body mass index (BMI) 35.0-35.9, adult: Secondary | ICD-10-CM

## 2021-12-23 DIAGNOSIS — Z79899 Other long term (current) drug therapy: Secondary | ICD-10-CM

## 2021-12-23 DIAGNOSIS — E559 Vitamin D deficiency, unspecified: Secondary | ICD-10-CM | POA: Diagnosis not present

## 2021-12-23 DIAGNOSIS — E229 Hyperfunction of pituitary gland, unspecified: Secondary | ICD-10-CM

## 2021-12-23 MED ORDER — NALTREXONE-BUPROPION HCL ER 8-90 MG PO TB12: ORAL_TABLET | ORAL | 1 refills | Status: DC

## 2021-12-23 NOTE — Patient Instructions (Addendum)
Exercising to Lose Weight ?Getting regular exercise is important for everyone. It is especially important if you are overweight. Being overweight increases your risk of heart disease, stroke, diabetes, high blood pressure, and several types of cancer. Exercising, and reducing the calories you consume, can help you lose weight and improve fitness and health. ?Exercise can be moderate or vigorous intensity. To lose weight, most people need to do a certain amount of moderate or vigorous-intensity exercise each week. ?How can exercise affect me? ?You lose weight when you exercise enough to burn more calories than you eat. Exercise also reduces body fat and builds muscle. The more muscle you have, the more calories you burn. Exercise also: ?Improves mood. ?Reduces stress and tension. ?Improves your overall fitness, flexibility, and endurance. ?Increases bone strength. ?Moderate-intensity exercise ? ?Moderate-intensity exercise is any activity that gets you moving enough to burn at least three times more energy (calories) than if you were sitting. ?Examples of moderate exercise include: ?Walking a mile in 15 minutes. ?Doing light yard work. ?Biking at an easy pace. ?Most people should get at least 150 minutes of moderate-intensity exercise a week to maintain their body weight. ?Vigorous-intensity exercise ?Vigorous-intensity exercise is any activity that gets you moving enough to burn at least six times more calories than if you were sitting. When you exercise at this intensity, you should be working hard enough that you are not able to carry on a conversation. ?Examples of vigorous exercise include: ?Running. ?Playing a team sport, such as football, basketball, and soccer. ?Jumping rope. ?Most people should get at least 75 minutes a week of vigorous exercise to maintain their body weight. ?What actions can I take to lose weight? ?The amount of exercise you need to lose weight depends on: ?Your age. ?The type of  exercise. ?Any health conditions you have. ?Your overall physical ability. ?Talk to your health care provider about how much exercise you need and what types of activities are safe for you. ?Nutrition ? ?Make changes to your diet as told by your health care provider or diet and nutrition specialist (dietitian). This may include: ?Eating fewer calories. ?Eating more protein. ?Eating less unhealthy fats. ?Eating a diet that includes fresh fruits and vegetables, whole grains, low-fat dairy products, and lean protein. ?Avoiding foods with added fat, salt, and sugar. ?Drink plenty of water while you exercise to prevent dehydration or heat stroke. ?Activity ?Choose an activity that you enjoy and set realistic goals. Your health care provider can help you make an exercise plan that works for you. ?Exercise at a moderate or vigorous intensity most days of the week. ?The intensity of exercise may vary from person to person. You can tell how intense a workout is for you by paying attention to your breathing and heartbeat. Most people will notice their breathing and heartbeat get faster with more intense exercise. ?Do resistance training twice each week, such as: ?Push-ups. ?Sit-ups. ?Lifting weights. ?Using resistance bands. ?Getting short amounts of exercise can be just as helpful as long, structured periods of exercise. If you have trouble finding time to exercise, try doing these things as part of your daily routine: ?Get up, stretch, and walk around every 30 minutes throughout the day. ?Go for a walk during your lunch break. ?Park your car farther away from your destination. ?If you take public transportation, get off one stop early and walk the rest of the way. ?Make phone calls while standing up and walking around. ?Take the stairs instead of elevators or escalators. ?  Wear comfortable clothes and shoes with good support. Do not exercise so much that you hurt yourself, feel dizzy, or get very short of breath. Where to  find more information [place] Department of Health and Human Services: [employer].at Centers for Disease Control and Prevention: http://www.wolf.info/ Contact a health care provider: Before starting a new exercise program. If you have questions or concerns about your weight. If you have a medical problem that keeps you from exercising. Get help right away if: You have any of the following while exercising: Injury. Dizziness. Difficulty breathing or shortness of breath that does not go away when you stop exercising. Chest pain. Rapid heartbeat. These symptoms may represent a serious problem that is an emergency. Do not wait to see if the symptoms will go away. Get medical help right away. Call your local emergency services (911 in the [place] Do not drive yourself to the hospital. Summary Getting regular exercise is especially important if you are overweight. Being overweight increases your risk of heart disease, stroke, diabetes, high blood pressure, and several types of cancer. Losing weight happens when you burn more calories than you eat. Reducing the amount of calories you eat, and getting regular moderate or vigorous exercise each week, helps you lose weight. This information is not intended to replace advice given to you by your health care provider. Make sure you discuss any questions you have with your health care provider. Document Revised: 06/02/2020 Document Reviewed: 06/02/2020 Elsevier Patient Education  [place] instructions - Start 1 tablet every morning for 7 days, then 1 tablet twice daily for 7 days, then 2 tablets every morning and one in the evening

## 2021-12-23 NOTE — Progress Notes (Signed)
I,Victoria T Hamilton,acting as a Education administrator for Minette Brine, FNP.,have documented all relevant documentation on the behalf of Minette Brine, FNP,as directed by  Minette Brine, FNP while in the presence of Minette Brine, [place].   Subjective:     Patient ID: Elizabeth Graham , female    DOB: 03-07-1981 , 41 y.o.   MRN: [id number]   Chief Complaint  Patient presents with   Weight Check    HPI  Here for weight check. She would like a refill on phentermine.   She reports being without phentermine for 2 weeks, since she had missed her appt.   She admits exercising at the gym and will get 6,000 steps, she will typically get 5 days a week in. Diet is not as good due to eating out with her current situation. She is trying to increasing her water intake. She has noticed her waist has decreased in size.  Patient currently takes care of her mother (due to 2 strokes back to back) and lives in a nursing home  & father in hospice (related to a heart attack). He lives in [place], [place]. She is hoping her siblings will relocate to be able to help with his care.   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   Polygalactia      Family History  Problem Relation Age of Onset   Diabetes Mother    Hypertension Mother    Heart disease Mother      Current Outpatient Medications:    cetirizine (ZYRTEC) 10 MG tablet, Take 1 tablet (10 mg total) by mouth daily., Disp: 90 tablet, Rfl: 1   fluticasone (FLONASE) 50 MCG/ACT nasal spray, Place 2 sprays into both nostrils daily., Disp: , Rfl:    ibuprofen (ADVIL,MOTRIN) 800 MG tablet, Take 1 tablet (800 mg total) by mouth every 8 (eight) hours as needed for headache., Disp: 90 tablet, Rfl: 0   Naltrexone-buPROPion HCl ER 8-90 MG TB12, Start 1 tablet every morning for 7  days, then 1 tablet twice daily for 7 days, then 2 tablets every morning and one in the evening, Disp: 120 tablet, Rfl: 1   norgestrel-ethinyl estradiol (LO/OVRAL) 0.3-30 MG-MCG tablet, Take 1 tablet by mouth daily., Disp: 90 tablet, Rfl: 3   Phentermine-Topiramate (QSYMIA) 3.75-23 MG CP24, Take 1 tablet by mouth daily., Disp: 14 capsule, Rfl: 0   No Known Allergies   Review of Systems  Constitutional: Negative.   Respiratory: Negative.    Cardiovascular: Negative.   Neurological: Negative.   Psychiatric/Behavioral: Negative.       Today's Vitals   12/23/21 0848  BP: (!) 140/68  Pulse: 93  Weight: 201 lb (91.2 kg)  Height: _0  (1.575 m)  PainSc: 0-No pain   Body mass index is 36.76 kg/m.   Objective:  Physical Exam Vitals reviewed.  Constitutional:      General: She is not in acute distress.    Appearance: Normal appearance. She is obese.  Cardiovascular:     Rate and Rhythm: Normal rate and regular rhythm.     Pulses: Normal pulses.     Heart sounds: Normal heart sounds. No murmur heard. Pulmonary:     Effort: Pulmonary effort is normal. No respiratory distress.  Breath sounds: Normal breath sounds. No wheezing.  Neurological:     General: No focal deficit present.     Mental Status: She is alert and oriented to person, place, and time.     Cranial Nerves: No cranial nerve deficit.     Motor: No weakness.  Psychiatric:        Mood and Affect: Mood normal.        Behavior: Behavior normal.        Thought Content: Thought content normal.        Judgment: Judgment normal.         Assessment And Plan:     1. Chronic idiopathic constipation Comments: Continues to have good bowel movements  2. Vitamin D deficiency Will check vitamin D level and supplement as needed.    Also encouraged to spend 15 minutes in the sun daily.  - VITAMIN D 25 Hydroxy (Vit-D Deficiency, Fractures)  3. Other long term (current) drug therapy - CBC  4. Pituitary microadenoma  with hyperprolactinemia (HCC) - Prolactin  5. Class 2 obesity due to excess calories without serious comorbidity with body mass index (BMI) of 35.0 to 35.9 in adult She is encouraged to strive for BMI less than 30 to decrease cardiac risk. Advised to aim for at least 150 minutes of exercise per week., will try her on contrave - Naltrexone-buPROPion HCl ER 8-90 MG TB12; Start 1 tablet every morning for 7 days, then 1 tablet twice daily for 7 days, then 2 tablets every morning and one in the evening  Dispense: 120 tablet; Refill: 1 - Hemoglobin A1c - CMP14+EGFR - Lipid panel     Patient was given opportunity to ask questions. Patient verbalized understanding of the plan and was able to repeat key elements of the plan. All questions were answered to their satisfaction.  Minette Brine, FNP   I, Minette Brine, FNP, have reviewed all documentation for this visit. The documentation on 12/23/21 for the exam, diagnosis, procedures, and orders are all accurate and complete.   IF YOU HAVE BEEN REFERRED TO A SPECIALIST, IT MAY TAKE 1-2 WEEKS TO SCHEDULE/PROCESS THE REFERRAL. IF YOU HAVE NOT HEARD FROM US/SPECIALIST IN TWO WEEKS, PLEASE GIVE [place] A CALL AT [phone] X [phone].   THE PATIENT IS ENCOURAGED TO PRACTICE SOCIAL DISTANCING DUE TO THE COVID-19 PANDEMIC.

## 2021-12-24 LAB — HEMOGLOBIN A1C
Est. average glucose Bld gHb Est-mCnc: 105 mg/dL
Hgb A1c MFr Bld: 5.3 % (ref 4.8–5.6)

## 2021-12-24 LAB — CBC
Hematocrit: 39 % (ref 34.0–46.6)
Hemoglobin: 12.8 g/dL (ref 11.1–15.9)
MCH: 30.2 pg (ref 26.6–33.0)
MCHC: 32.8 g/dL (ref 31.5–35.7)
MCV: 92 fL (ref 79–97)
Platelets: 367 10*3/uL (ref 150–450)
RBC: 4.24 x10E6/uL (ref 3.77–5.28)
RDW: 12.1 % (ref 11.7–15.4)
WBC: 5.1 10*3/uL (ref 3.4–10.8)

## 2021-12-24 LAB — CMP14+EGFR
ALT: 11 IU/L (ref 0–32)
AST: 15 IU/L (ref 0–40)
Albumin/Globulin Ratio: 1.5 (ref 1.2–2.2)
Albumin: 4 g/dL (ref 3.9–4.9)
Alkaline Phosphatase: 66 IU/L (ref 44–121)
BUN/Creatinine Ratio: 6 — ABNORMAL LOW (ref 9–23)
BUN: 5 mg/dL — ABNORMAL LOW (ref 6–24)
Bilirubin Total: 0.2 mg/dL (ref 0.0–1.2)
CO2: 23 mmol/L (ref 20–29)
Calcium: 9.3 mg/dL (ref 8.7–10.2)
Chloride: 104 mmol/L (ref 96–106)
Creatinine, Ser: 0.79 mg/dL (ref 0.57–1.00)
Globulin, Total: 2.7 g/dL (ref 1.5–4.5)
Glucose: 88 mg/dL (ref 70–99)
Potassium: 4.5 mmol/L (ref 3.5–5.2)
Sodium: 140 mmol/L (ref 134–144)
Total Protein: 6.7 g/dL (ref 6.0–8.5)
eGFR: 96 mL/min/{1.73_m2} (ref >59)

## 2021-12-24 LAB — LIPID PANEL
Chol/HDL Ratio: 2.7 ratio (ref 0.0–4.4)
Cholesterol, Total: 139 mg/dL (ref 100–199)
HDL: 52 mg/dL (ref >39)
LDL Chol Calc (NIH): 77 mg/dL (ref 0–99)
Triglycerides: 44 mg/dL (ref 0–149)
VLDL Cholesterol Cal: 10 mg/dL (ref 5–40)

## 2021-12-24 LAB — PROLACTIN: Prolactin: 15.3 ng/mL (ref 4.8–23.3)

## 2021-12-24 LAB — VITAMIN D 25 HYDROXY (VIT D DEFICIENCY, FRACTURES): Vit D, 25-Hydroxy: 29.8 ng/mL — ABNORMAL LOW (ref 30.0–100.0)

## 2022-01-08 ENCOUNTER — Other Ambulatory Visit: Payer: Self-pay

## 2022-01-08 ENCOUNTER — Other Ambulatory Visit: Payer: Self-pay | Admitting: Nurse Practitioner

## 2022-01-08 DIAGNOSIS — E6609 Other obesity due to excess calories: Secondary | ICD-10-CM

## 2022-01-08 MED ORDER — NALTREXONE-BUPROPION HCL ER 8-90 MG PO TB12: ORAL_TABLET | ORAL | 1 refills | Status: DC

## 2022-03-09 ENCOUNTER — Encounter: Payer: Self-pay | Admitting: Nurse Practitioner

## 2022-03-09 ENCOUNTER — Ambulatory Visit (INDEPENDENT_AMBULATORY_CARE_PROVIDER_SITE_OTHER): Payer: Medicaid Other | Admitting: Nurse Practitioner

## 2022-03-09 VITALS — BP 128/70 | HR 78 | Temp 98.2°F | Ht 62.0 in | Wt 195.0 lb

## 2022-03-09 DIAGNOSIS — Z6835 Body mass index (BMI) 35.0-35.9, adult: Secondary | ICD-10-CM | POA: Diagnosis not present

## 2022-03-09 DIAGNOSIS — E6609 Other obesity due to excess calories: Secondary | ICD-10-CM

## 2022-03-09 MED ORDER — NALTREXONE-BUPROPION HCL ER 8-90 MG PO TB12: ORAL_TABLET | ORAL | 1 refills | Status: DC

## 2022-03-09 NOTE — Progress Notes (Signed)
I,Tianna Badgett,acting as a Education administrator for [employer], FNP.,have documented all relevant documentation on the behalf of Minette Brine, FNP,as directed by  Minette Brine, FNP while in the presence of Minette Brine, [place].  Subjective:     Patient ID: Elizabeth Graham , female    DOB: [birth year] , 41 y.o.   MRN: [id number]   Chief Complaint  Patient presents with   Weight Check    HPI  Here for weight check. She reports she has been taking phentermine daily. She is exercising Mon-Fri more than the last visit. She feels like her stomach is decreasing. She is eating more vegetables and protein. She has cut out her sweets and bread. She is now officially divorced. She is now taking contrave 2 tabs twice a day and doing well with it.   Wt Readings from Last 3 Encounters: 03/09/22 : 195 lb (88.5 kg) 12/23/21 : 201 lb (91.2 kg) 09/18/21 : 196 lb (88.9 kg)  She feels a lot less stressed and busy.      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   Polygalactia      Family History  Problem Relation Age of Onset   Diabetes Mother    Hypertension Mother    Heart disease Mother      Current Outpatient Medications:    cetirizine (ZYRTEC) 10 MG tablet, Take 1 tablet (10 mg total) by mouth daily., Disp: 90 tablet, Rfl: 1   fluticasone (FLONASE) 50 MCG/ACT nasal spray, Place 2 sprays into both nostrils daily., Disp: , Rfl:    ibuprofen (ADVIL,MOTRIN) 800 MG tablet, Take 1 tablet (800 mg total) by mouth every 8 (eight) hours as needed for headache., Disp: 90 tablet, Rfl: 0   norgestrel-ethinyl estradiol (LO/OVRAL) 0.3-30 MG-MCG tablet, Take 1 tablet by mouth daily., Disp: 90 tablet, Rfl: 3   Naltrexone-buPROPion HCl ER 8-90 MG TB12, Take 2 tablet every morning and every evening, Disp: 120 tablet, Rfl: 1   No Known Allergies   Review of Systems  Constitutional: Negative.   Respiratory: Negative.    Cardiovascular: Negative.    Gastrointestinal: Negative.   Neurological: Negative.   Psychiatric/Behavioral: Negative.       Today's Vitals   03/09/22 0834  BP: 128/70  Pulse: 78  Temp: 98.2 F (36.8 C)  TempSrc: Oral  Weight: 195 lb (88.5 kg)  Height: '5\' 2"'$  (1.575 m)   Body mass index is 35.67 kg/m.  Wt Readings from Last 3 Encounters:  03/09/22 195 lb (88.5 kg)  12/23/21 201 lb (91.2 kg)  09/18/21 196 lb (88.9 kg)    Objective:  Physical Exam Vitals reviewed.  Constitutional:      General: She is not in acute distress.    Appearance: Normal appearance. She is obese.  Cardiovascular:     Rate and Rhythm: Normal rate and regular rhythm.     Pulses: Normal pulses.     Heart sounds: Normal heart sounds. No murmur heard. Pulmonary:     Effort: Pulmonary effort is normal. No respiratory distress.     Breath sounds: Normal breath sounds. No wheezing.  Skin:    General: Skin is warm.     Capillary Refill: Capillary refill takes less than 2 seconds.  Neurological:     General: No focal deficit present.     Mental Status: She is alert and oriented to person, place, and time.     Cranial Nerves: No cranial nerve deficit.  Motor: No weakness.         Assessment And Plan:     1. Class 2 obesity due to excess calories without serious comorbidity with body mass index (BMI) of 35.0 to 35.9 in adult She is encouraged to strive for BMI less than 30 to decrease cardiac risk. Advised to aim for at least 150 minutes of exercise per week. Continue contrave as directed.  - Naltrexone-buPROPion HCl ER 8-90 MG TB12; Take 2 tablet every morning and every evening  Dispense: 120 tablet; Refill: 1   Patient was given opportunity to ask questions. Patient verbalized understanding of the plan and was able to repeat key elements of the plan. All questions were answered to their satisfaction.  Minette Brine, FNP   I, Minette Brine, FNP, have reviewed all documentation for this visit. The documentation on 03/09/22  for the exam, diagnosis, procedures, and orders are all accurate and complete.   IF YOU HAVE BEEN REFERRED TO A SPECIALIST, IT MAY TAKE 1-2 WEEKS TO SCHEDULE/PROCESS THE REFERRAL. IF YOU HAVE NOT HEARD FROM US/SPECIALIST IN TWO WEEKS, PLEASE GIVE [place] A CALL AT [phone].   THE PATIENT IS ENCOURAGED TO PRACTICE SOCIAL DISTANCING DUE TO THE COVID-19 PANDEMIC.

## 2022-03-09 NOTE — Patient Instructions (Addendum)
Obesity, Adult ?Obesity is having too much body fat. Being obese means that your weight is more than what is healthy for you.  ?BMI (body mass index) is a number that explains how much body fat you have. If you have a BMI of 30 or more, you are obese. ?Obesity can cause serious health problems, such as: ?Stroke. ?Coronary artery disease (CAD). ?Type 2 diabetes. ?Some types of cancer. ?High blood pressure (hypertension). ?High cholesterol. ?Gallbladder stones. ?Obesity can also contribute to: ?Osteoarthritis. ?Sleep apnea. ?Infertility problems. ?What are the causes? ?Eating meals each day that are high in calories, sugar, and fat. ?Drinking a lot of drinks that have sugar in them. ?Being born with genes that may make you more likely to become obese. ?Having a medical condition that causes obesity. ?Taking certain medicines. ?Sitting a lot (having a sedentary lifestyle). ?Not getting enough sleep. ?What increases the risk? ?Having a family history of obesity. ?Living in an area with limited access to: ?Parks, recreation centers, or sidewalks. ?Healthy food choices, such as grocery stores and farmers' markets. ?What are the signs or symptoms? ?The main sign is having too much body fat. ?How is this treated? ?Treatment for this condition often includes changing your lifestyle. Treatment may include: ?Changing your diet. This may include making a healthy meal plan. ?Exercise. This may include activity that causes your heart to beat faster (aerobic exercise) and strength training. Work with your doctor to design a program that works for you. ?Medicine to help you lose weight. This may be used if you are not able to lose one pound a week after 6 weeks of healthy eating and more exercise. ?Treating conditions that cause the obesity. ?Surgery. Options may include gastric banding and gastric bypass. This may be done if: ?Other treatments have not helped to improve your condition. ?You have a BMI of 40 or higher. ?You have  life-threatening health problems related to obesity. ?Follow these instructions at home: ?Eating and drinking ? ?Follow advice from your doctor about what to eat and drink. Your doctor may tell you to: ?Limit fast food, sweets, and processed snack foods. ?Choose low-fat options. For example, choose low-fat milk instead of whole milk. ?Eat five or more servings of fruits or vegetables each day. ?Eat at home more often. This gives you more control over what you eat. ?Choose healthy foods when you eat out. ?Learn to read food labels. This will help you learn how much food is in one serving. ?Keep low-fat snacks available. ?Avoid drinks that have a lot of sugar in them. These include soda, fruit juice, iced tea with sugar, and flavored milk. ?Drink enough water to keep your pee (urine) pale yellow. ?Do not go on fad diets. ?Physical activity ?Exercise often, as told by your doctor. Most adults should get up to 150 minutes of moderate-intensity exercise every week.Ask your doctor: ?What types of exercise are safe for you. ?How often you should exercise. ?Warm up and stretch before being active. ?Do slow stretching after being active (cool down). ?Rest between times of being active. ?Lifestyle ?Work with your doctor and a food expert (dietitian) to set a weight-loss goal that is best for you. ?Limit your screen time. ?Find ways to reward yourself that do not involve food. ?Do not drink alcohol if: ?Your doctor tells you not to drink. ?You are pregnant, may be pregnant, or are planning to become pregnant. ?If you drink alcohol: ?Limit how much you have to: ?0-1 drink a day for women. ?0-2 drinks   a day for men. Know how much alcohol is in your drink. In the [place] one drink equals one 12 oz bottle of beer (355 mL), one 5 oz glass of wine (148 mL), or one 1 oz glass of hard liquor (44 mL). General instructions Keep a weight-loss journal. This can help you keep track of: The food that you eat. How much exercise you  get. Take over-the-counter and prescription medicines only as told by your doctor. Take vitamins and supplements only as told by your doctor. Think about joining a support group. Pay attention to your mental health as obesity can lead to depression or self esteem issues. Keep all follow-up visits. Contact a doctor if: You cannot meet your weight-loss goal after you have changed your diet and lifestyle for 6 weeks. You are having trouble breathing. Summary Obesity is having too much body fat. Being obese means that your weight is more than what is healthy for you. Work with your doctor to set a weight-loss goal. Get regular exercise as told by your doctor. This information is not intended to replace advice given to you by your health care provider. Make sure you discuss any questions you have with your health care provider. Document Revised: 11/12/2020 Document Reviewed: 11/12/2020 Elsevier Patient Education  [place] up the good work!

## 2022-03-10 ENCOUNTER — Ambulatory Visit: Admitting: Nurse Practitioner

## 2022-05-11 ENCOUNTER — Other Ambulatory Visit: Payer: Self-pay

## 2022-05-11 ENCOUNTER — Ambulatory Visit: Payer: Medicaid Other | Admitting: Nurse Practitioner

## 2022-05-11 ENCOUNTER — Encounter: Payer: Self-pay | Admitting: Nurse Practitioner

## 2022-05-11 VITALS — BP 128/78 | HR 91 | Temp 98.8°F | Ht 62.0 in | Wt 193.0 lb

## 2022-05-11 DIAGNOSIS — Z6835 Body mass index (BMI) 35.0-35.9, adult: Secondary | ICD-10-CM | POA: Diagnosis not present

## 2022-05-11 DIAGNOSIS — E6609 Other obesity due to excess calories: Secondary | ICD-10-CM

## 2022-05-11 MED ORDER — NALTREXONE-BUPROPION HCL ER 8-90 MG PO TB12: ORAL_TABLET | ORAL | 1 refills | Status: DC

## 2022-05-11 MED ORDER — CETIRIZINE HCL 10 MG PO TABS: 10.0000 mg | ORAL_TABLET | Freq: Every day | ORAL | 1 refills | Status: DC

## 2022-05-11 NOTE — Patient Instructions (Addendum)
Obesity, Adult ?Obesity is having too much body fat. Being obese means that your weight is more than what is healthy for you.  ?BMI (body mass index) is a number that explains how much body fat you have. If you have a BMI of 30 or more, you are obese. ?Obesity can cause serious health problems, such as: ?Stroke. ?Coronary artery disease (CAD). ?Type 2 diabetes. ?Some types of cancer. ?High blood pressure (hypertension). ?High cholesterol. ?Gallbladder stones. ?Obesity can also contribute to: ?Osteoarthritis. ?Sleep apnea. ?Infertility problems. ?What are the causes? ?Eating meals each day that are high in calories, sugar, and fat. ?Drinking a lot of drinks that have sugar in them. ?Being born with genes that may make you more likely to become obese. ?Having a medical condition that causes obesity. ?Taking certain medicines. ?Sitting a lot (having a sedentary lifestyle). ?Not getting enough sleep. ?What increases the risk? ?Having a family history of obesity. ?Living in an area with limited access to: ?Parks, recreation centers, or sidewalks. ?Healthy food choices, such as grocery stores and farmers' markets. ?What are the signs or symptoms? ?The main sign is having too much body fat. ?How is this treated? ?Treatment for this condition often includes changing your lifestyle. Treatment may include: ?Changing your diet. This may include making a healthy meal plan. ?Exercise. This may include activity that causes your heart to beat faster (aerobic exercise) and strength training. Work with your doctor to design a program that works for you. ?Medicine to help you lose weight. This may be used if you are not able to lose one pound a week after 6 weeks of healthy eating and more exercise. ?Treating conditions that cause the obesity. ?Surgery. Options may include gastric banding and gastric bypass. This may be done if: ?Other treatments have not helped to improve your condition. ?You have a BMI of 40 or higher. ?You have  life-threatening health problems related to obesity. ?Follow these instructions at home: ?Eating and drinking ? ?Follow advice from your doctor about what to eat and drink. Your doctor may tell you to: ?Limit fast food, sweets, and processed snack foods. ?Choose low-fat options. For example, choose low-fat milk instead of whole milk. ?Eat five or more servings of fruits or vegetables each day. ?Eat at home more often. This gives you more control over what you eat. ?Choose healthy foods when you eat out. ?Learn to read food labels. This will help you learn how much food is in one serving. ?Keep low-fat snacks available. ?Avoid drinks that have a lot of sugar in them. These include soda, fruit juice, iced tea with sugar, and flavored milk. ?Drink enough water to keep your pee (urine) pale yellow. ?Do not go on fad diets. ?Physical activity ?Exercise often, as told by your doctor. Most adults should get up to 150 minutes of moderate-intensity exercise every week.Ask your doctor: ?What types of exercise are safe for you. ?How often you should exercise. ?Warm up and stretch before being active. ?Do slow stretching after being active (cool down). ?Rest between times of being active. ?Lifestyle ?Work with your doctor and a food expert (dietitian) to set a weight-loss goal that is best for you. ?Limit your screen time. ?Find ways to reward yourself that do not involve food. ?Do not drink alcohol if: ?Your doctor tells you not to drink. ?You are pregnant, may be pregnant, or are planning to become pregnant. ?If you drink alcohol: ?Limit how much you have to: ?0-1 drink a day for women. ?0-2 drinks   a day for men. Know how much alcohol is in your drink. In the [place] one drink equals one 12 oz bottle of beer (355 mL), one 5 oz glass of wine (148 mL), or one 1 oz glass of hard liquor (44 mL). General instructions Keep a weight-loss journal. This can help you keep track of: The food that you eat. How much exercise you  get. Take over-the-counter and prescription medicines only as told by your doctor. Take vitamins and supplements only as told by your doctor. Think about joining a support group. Pay attention to your mental health as obesity can lead to depression or self esteem issues. Keep all follow-up visits. Contact a doctor if: You cannot meet your weight-loss goal after you have changed your diet and lifestyle for 6 weeks. You are having trouble breathing. Summary Obesity is having too much body fat. Being obese means that your weight is more than what is healthy for you. Work with your doctor to set a weight-loss goal. Get regular exercise as told by your doctor. This information is not intended to replace advice given to you by your health care provider. Make sure you discuss any questions you have with your health care provider. Document Revised: 11/12/2020 Document Reviewed: 11/12/2020 Elsevier Patient Education  [place].  Encouraged to add 2 days of strength training to exercise regimen and eats carbs earlier in the day.  https://[email or web address].org/hometown-health/speaking-of-health/no-matter-your-age-or-skill-level-its-never-too-late-to-start-weight-training

## 2022-05-11 NOTE — Progress Notes (Signed)
I,Tianna Badgett,acting as a Education administrator for [employer], FNP.,have documented all relevant documentation on the behalf of Minette Brine, FNP,as directed by  Minette Brine, FNP while in the presence of Minette Brine, [place].  Subjective:     Patient ID: Elizabeth Graham , female    DOB: 1981-02-10 , 42 y.o.   MRN: [id number]   Chief Complaint  Patient presents with   Weight Check    HPI  Here for weight check. She reports she has been taking Contrave two tabs am and two tabs pm. Diet: breakfast - meal replacement or oatmel, lunch - tuna or salad, dinner - she will eat pasta sometimes at night, meats, and vegetables (2).  She is drinking about 16 oz (8) per day. Exercising M-F at gym for 45 minutes to 1 hour. She will ride bike, crunches on machine, elliptical, crunches on machine.   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   Polygalactia      Family History  Problem Relation Age of Onset   Diabetes Mother    Hypertension Mother    Heart disease Mother      Current Outpatient Medications:    fluticasone (FLONASE) 50 MCG/ACT nasal spray, Place 2 sprays into both nostrils daily., Disp: , Rfl:    ibuprofen (ADVIL,MOTRIN) 800 MG tablet, Take 1 tablet (800 mg total) by mouth every 8 (eight) hours as needed for headache., Disp: 90 tablet, Rfl: 0   norgestrel-ethinyl estradiol (LO/OVRAL) 0.3-30 MG-MCG tablet, Take 1 tablet by mouth daily., Disp: 90 tablet, Rfl: 3   cetirizine (ZYRTEC) 10 MG tablet, Take 1 tablet (10 mg total) by mouth daily., Disp: 90 tablet, Rfl: 1   Naltrexone-buPROPion HCl ER 8-90 MG TB12, Take 2 tablet every morning and every evening, Disp: 120 tablet, Rfl: 1   No Known Allergies   Review of Systems  Constitutional: Negative.   Respiratory: Negative.    Cardiovascular:  Negative.   Gastrointestinal: Negative.   Neurological: Negative.   Psychiatric/Behavioral: Negative.       Today's Vitals   05/11/22 0819  BP: 128/78  Pulse: 91  Temp: 98.8 F (37.1 C)  TempSrc: Oral  Weight: 193 lb (87.5 kg)  Height: '5\' 2"'$  (1.575 m)   Body mass index is 35.3 kg/m.   Objective:  Physical Exam Vitals reviewed.  Constitutional:      General: She is not in acute distress.    Appearance: Normal appearance. She is obese.  Pulmonary:     Effort: Pulmonary effort is normal. No respiratory distress.  Skin:    General: Skin is warm and dry.     Capillary Refill: Capillary refill takes less than 2 seconds.  Neurological:     General: No focal deficit present.     Mental Status: She is alert and oriented to person, place, and time.     Cranial Nerves: No cranial nerve deficit.     Motor: No weakness.         Assessment And Plan:     1. Class 2 obesity due to excess calories without serious comorbidity with body mass index (BMI) of 35.0 to 35.9 in adult ASK- here for scheduled weight check ASSESS - Body mass index is 35.3 kg/m., well tolerated, WAIST CIRCUMFERENCE 40 inches, weight  today is 193 lbs.  ADVISE  - eating her carbs earlier in the day and risk of cardiovascular disease with a waist circumference of greater than 35 inches. She is encouraged to strive for BMI less than 30 to decrease cardiac risk. AGREE - to incorporate more strength training, continue contrave ASSIST - encouraged to add at least 2 days of strength training and provided a link to the [facility] for beginners strength training.  - Naltrexone-buPROPion HCl ER 8-90 MG TB12; Take 2 tablet every morning and every evening  Dispense: 120 tablet; Refill: 1      Patient was given opportunity to ask questions. Patient verbalized understanding of the plan and was able to repeat key elements of the plan. All questions were answered to their satisfaction.  Minette Brine, FNP   I, Minette Brine, FNP, have reviewed all documentation for this visit. The documentation on 05/11/22 for the exam, diagnosis, procedures, and orders are all accurate and complete.   IF YOU HAVE BEEN REFERRED TO A SPECIALIST, IT MAY TAKE 1-2 WEEKS TO SCHEDULE/PROCESS THE REFERRAL. IF YOU HAVE NOT HEARD FROM US/SPECIALIST IN TWO WEEKS, PLEASE GIVE [place] A CALL AT [phone].   THE PATIENT IS ENCOURAGED TO PRACTICE SOCIAL DISTANCING DUE TO THE COVID-19 PANDEMIC.

## 2022-07-21 ENCOUNTER — Encounter: Admitting: Nurse Practitioner

## 2022-08-11 ENCOUNTER — Other Ambulatory Visit: Payer: Self-pay

## 2022-08-11 DIAGNOSIS — Z3041 Encounter for surveillance of contraceptive pills: Secondary | ICD-10-CM

## 2022-08-11 MED ORDER — NORGESTREL-ETHINYL ESTRADIOL 0.3-30 MG-MCG PO TABS: 1.0000 | ORAL_TABLET | Freq: Every day | ORAL | 3 refills | Status: DC

## 2023-07-03 ENCOUNTER — Other Ambulatory Visit: Payer: Self-pay | Admitting: Nurse Practitioner

## 2023-07-19 ENCOUNTER — Other Ambulatory Visit: Payer: Self-pay | Admitting: Nurse Practitioner

## 2023-07-19 DIAGNOSIS — Z3041 Encounter for surveillance of contraceptive pills: Secondary | ICD-10-CM

## 2023-08-04 ENCOUNTER — Other Ambulatory Visit: Payer: Self-pay | Admitting: Nurse Practitioner

## 2023-08-04 DIAGNOSIS — E6609 Other obesity due to excess calories: Secondary | ICD-10-CM

## 2023-09-28 ENCOUNTER — Other Ambulatory Visit: Payer: Self-pay | Admitting: Nurse Practitioner

## 2023-09-28 DIAGNOSIS — Z3041 Encounter for surveillance of contraceptive pills: Secondary | ICD-10-CM

## 2023-10-04 NOTE — Progress Notes (Signed)
 [name], CMA,acting as a Neurosurgeon for [name], FNP.,have documented all relevant documentation on the behalf of [name], FNP,as directed by  [name], FNP while in the presence of [name], FNP.  Subjective:  Patient ID: Elizabeth Graham , female    DOB: January 17, 1981 , 43 y.o.   MRN: [id number]  Chief Complaint  Patient presents with   Contraception    Patient presents today for birth control, Patient reports compliance with medication. Patient denies any chest pain, SOB, or headaches.    Obesity    Patient would like to see about something for weight loss. She would like to start contrave  again.     HPI  Here to restart her birth control.  She was taking contrave  previously. But had been going through her divorce. She was having some issues with depression. 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 She is now working at [employer].   She is exercising with walking at work and at least 3 days a week.  Diet good - oatmeal, lunch, she eats 3-4 small meals a day. She is not drinking sodas and not eating sweets.   LMP - May 21st.      Past Medical History:  Diagnosis Date   Obesity    Pituitary microadenoma with hyperprolactinemia (HCC) 2017   Polygalactia      Family History  Problem Relation Age of Onset   Diabetes Mother    Hypertension Mother    Heart disease Mother      Current Outpatient Medications:    cetirizine  (ZYRTEC ) 10 MG tablet, TAKE 1 TABLET(10 MG) BY MOUTH DAILY, Disp: 90 tablet, Rfl: 1   fluticasone (FLONASE) 50 MCG/ACT nasal spray, Place 2 sprays into both nostrils daily., Disp: , Rfl:    ibuprofen  (ADVIL ,MOTRIN ) 800 MG tablet, Take 1 tablet (800 mg total) by mouth every 8 (eight) hours as needed for headache., Disp: 90 tablet, Rfl: 0   Naltrexone -buPROPion  HCl ER 8-90 MG TB12, Take 2 tablet every morning and every evening, Disp: 120 tablet, Rfl: 1   norgestrel -ethinyl estradiol  (LO/OVRAL ) 0.3-30 MG-MCG tablet, Take 1 tablet by mouth  daily., Disp: 90 tablet, Rfl: 3   No Known Allergies   Review of Systems  Constitutional: Negative.   Respiratory: Negative.    Cardiovascular: Negative.   Gastrointestinal: Negative.   Neurological: Negative.   Psychiatric/Behavioral: Negative.       Today's Vitals   10/05/23 0958  BP: 100/68  Pulse: 71  Temp: 98 F (36.7 C)  TempSrc: Oral  Weight: 204 lb (92.5 kg)  Height: 5' 2 (1.575 m)  PainSc: 0-No pain   Body mass index is 37.31 kg/m.  Wt Readings from Last 3 Encounters:  10/05/23 204 lb (92.5 kg)  05/11/22 193 lb (87.5 kg)  03/09/22 195 lb (88.5 kg)    Objective:  Physical Exam Vitals and nursing note reviewed.  Constitutional:      General: She is not in acute distress.    Appearance: Normal appearance. She is obese.  Pulmonary:     Effort: Pulmonary effort is normal. No respiratory distress.   Skin:    General: Skin is warm and dry.     Capillary Refill: Capillary refill takes less than 2 seconds.   Neurological:     General: No focal deficit present.     Mental Status: She is alert and oriented to person, place, and time.     Cranial Nerves: No cranial nerve deficit.  Motor: No weakness.         Assessment And Plan:  Encounter for surveillance of contraceptive pills Assessment & Plan: Refill for birth control pills, no adverse side effects.  Negative urine pregnancy  Orders: -     POCT urine pregnancy -     Norgestrel -Ethinyl Estradiol ; Take 1 tablet by mouth daily.  Dispense: 90 tablet; Refill: 3  COVID-19 vaccination declined Assessment & Plan: Declines covid 19 vaccine. Discussed risk of covid 71 and if she changes her mind about the vaccine to call the office. Education has been provided regarding the importance of this vaccine but patient still declined. Advised may receive this vaccine at local pharmacy or Health Dept.or vaccine clinic. Aware to provide a copy of the vaccination record if obtained from local pharmacy or Health  Dept.  Encouraged to take multivitamin, vitamin d , vitamin c and zinc to increase immune system. Aware can call office if would like to have vaccine here at office. Verbalized acceptance and understanding.     Class 2 obesity due to excess calories without serious comorbidity with body mass index (BMI) of 35.0 to 35.9 in adult Assessment & Plan: She has had an increase in her weight by 11 lbs since her last visit 1.5 years ago. She would like to restart contrave , Rx sent to [facility]. I did discuss with her the option to take [facility], she is not interested at this time.   Orders: -     Naltrexone -buPROPion  HCl ER; Take 2 tablet every morning and every evening  Dispense: 120 tablet; Refill: 1  Urine white blood cells increased Assessment & Plan: Trace blood and small leukocytes. Will send urine culture   Orders: -     Urine Culture -     POCT urinalysis dipstick    Return in about 4 months (around 02/04/2024) for phy when able, 2 months weight check.  Patient was given opportunity to ask questions. Patient verbalized understanding of the plan and was able to repeat key elements of the plan. All questions were answered to their satisfaction.    LILLETTE [name], FNP, have reviewed all documentation for this visit. The documentation on 10/05/23 for the exam, diagnosis, procedures, and orders are all accurate and complete.   IF YOU HAVE BEEN REFERRED TO A SPECIALIST, IT MAY TAKE 1-2 WEEKS TO SCHEDULE/PROCESS THE REFERRAL. IF YOU HAVE NOT HEARD FROM US /SPECIALIST IN TWO WEEKS, PLEASE GIVE US  A CALL AT [phone].

## 2023-10-05 ENCOUNTER — Ambulatory Visit: Admitting: Nurse Practitioner

## 2023-10-05 ENCOUNTER — Encounter: Payer: Self-pay | Admitting: Nurse Practitioner

## 2023-10-05 VITALS — BP 100/68 | HR 71 | Temp 98.0°F | Ht 62.0 in | Wt 204.0 lb

## 2023-10-05 DIAGNOSIS — E6609 Other obesity due to excess calories: Secondary | ICD-10-CM | POA: Diagnosis not present

## 2023-10-05 DIAGNOSIS — Z6835 Body mass index (BMI) 35.0-35.9, adult: Secondary | ICD-10-CM | POA: Diagnosis not present

## 2023-10-05 DIAGNOSIS — Z2821 Immunization not carried out because of patient refusal: Secondary | ICD-10-CM

## 2023-10-05 DIAGNOSIS — Z3041 Encounter for surveillance of contraceptive pills: Secondary | ICD-10-CM | POA: Diagnosis not present

## 2023-10-05 DIAGNOSIS — R82998 Other abnormal findings in urine: Secondary | ICD-10-CM | POA: Diagnosis not present

## 2023-10-05 DIAGNOSIS — E66812 Obesity, class 2: Secondary | ICD-10-CM

## 2023-10-05 LAB — POCT URINALYSIS DIPSTICK
Bilirubin, UA: NEGATIVE
Glucose, UA: NEGATIVE
Ketones, UA: NEGATIVE
Nitrite, UA: NEGATIVE
Protein, UA: NEGATIVE
Spec Grav, UA: >=1.03 — AB (ref 1.010–1.025)
Urobilinogen, UA: 1 U/dL
pH, UA: 6 (ref 5.0–8.0)

## 2023-10-05 LAB — POCT URINE PREGNANCY: Preg Test, Ur: NEGATIVE

## 2023-10-05 MED ORDER — NALTREXONE-BUPROPION HCL ER 8-90 MG PO TB12: ORAL_TABLET | ORAL | 1 refills | Status: DC

## 2023-10-05 MED ORDER — NORGESTREL-ETHINYL ESTRADIOL 0.3-30 MG-MCG PO TABS: 1.0000 | ORAL_TABLET | Freq: Every day | ORAL | 3 refills | Status: DC

## 2023-10-05 NOTE — Assessment & Plan Note (Addendum)
 Refill for birth control pills, no adverse side effects.  Negative urine pregnancy

## 2023-10-05 NOTE — Assessment & Plan Note (Signed)
 Trace blood and small leukocytes. Will send urine culture

## 2023-10-05 NOTE — Assessment & Plan Note (Addendum)
 She has had an increase in her weight by 11 lbs since her last visit 1.5 years ago. She would like to restart contrave , Rx sent to [facility]. I did discuss with her the option to take [facility], she is not interested at this time.

## 2023-10-05 NOTE — Patient Instructions (Addendum)
 Abdominal pain (stomach) pain (severe) Chest Pain (sharp, crushing or heaviness)  Headaches sudden severe or vomiting, dizziness or fainting, weakness or numbness in arm or leg Eye Problems - blurring vision, flashing lights or partial/complete vision loss Sudden leg pain in calf or thigh or redness.  Obesity Goal to exercise 150 minutes per week with at least 2 days of strength training Encouraged to park further when at the store, take stairs instead of elevators and to walk in place during commercials. Increase water intake to at least one gallon of water daily.

## 2023-10-05 NOTE — Assessment & Plan Note (Signed)

## 2023-10-07 LAB — URINE CULTURE

## 2023-12-09 ENCOUNTER — Ambulatory Visit: Admitting: Nurse Practitioner

## 2023-12-09 ENCOUNTER — Encounter: Payer: Self-pay | Admitting: Nurse Practitioner

## 2023-12-09 VITALS — BP 110/70 | HR 76 | Temp 98.5°F | Ht 62.0 in | Wt 202.0 lb

## 2023-12-09 DIAGNOSIS — Z6836 Body mass index (BMI) 36.0-36.9, adult: Secondary | ICD-10-CM

## 2023-12-09 DIAGNOSIS — E66812 Obesity, class 2: Secondary | ICD-10-CM

## 2023-12-09 DIAGNOSIS — E6609 Other obesity due to excess calories: Secondary | ICD-10-CM

## 2023-12-09 MED ORDER — NALTREXONE-BUPROPION HCL ER 8-90 MG PO TB12: ORAL_TABLET | ORAL | 1 refills | Status: DC

## 2023-12-09 NOTE — Progress Notes (Signed)
 [name], CMA,acting as a Neurosurgeon for [name], FNP.,have documented all relevant documentation on the behalf of [name], FNP,as directed by  [name], FNP while in the presence of [name], FNP.  Subjective:  Patient ID: Elizabeth Graham , female    DOB: 1980/08/02 , 43 y.o.   MRN: [id number]  Chief Complaint  Patient presents with   Obesity    Patient presents today for a weight follow up, Patient reports compliance with medication. Patient denies any chest pain, SOB, or headaches. Patient has no concerns today. Waist today is 42 and 1/2 inches.     HPI Discussed the use of AI scribe software for clinical note transcription with the patient, who gave verbal consent to proceed.  History of Present Illness A 43 year old female who presents for a weight loss visit.  She has successfully lost 2 pounds since her last visit, reducing her weight from 204 pounds to 202 pounds. She attributes this progress to her current regimen of taking two tablets of her medication twice a day, which she now obtains from a more affordable pharmacy.  Her physical activity includes walking at least 6,000 steps daily at her job at [employer], sometimes reaching up to 12,000 steps depending on her workload. Additionally, she attends the gym three times a week, focusing on cardio and using the app machine. She has also made dietary changes, reducing her food intake and limiting sweets, while increasing her water consumption.  She previously worked at [employer] in 2021, left, and then returned in 2024. She is currently without insurance but expects to obtain it in October when her Medicaid expires. She has received the hepatitis B vaccine through her workplace but does not have a copy of the record with her.  Her waist circumference was 40 inches in January 2024, and she reports it is now 42.5 inches. She reflects on her progress by comparing her current appearance to a driver's license photo  from 2022, noting significant changes over the past three years.   HPI  Past Medical History:  Diagnosis Date   Obesity    Pituitary microadenoma with hyperprolactinemia (HCC) 2017   Polygalactia      Family History  Problem Relation Age of Onset   Diabetes Mother    Hypertension Mother    Heart disease Mother      Current Outpatient Medications:    cetirizine  (ZYRTEC ) 10 MG tablet, TAKE 1 TABLET(10 MG) BY MOUTH DAILY, Disp: 90 tablet, Rfl: 1   fluticasone (FLONASE) 50 MCG/ACT nasal spray, Place 2 sprays into both nostrils daily., Disp: , Rfl:    ibuprofen  (ADVIL ,MOTRIN ) 800 MG tablet, Take 1 tablet (800 mg total) by mouth every 8 (eight) hours as needed for headache., Disp: 90 tablet, Rfl: 0   norgestrel -ethinyl estradiol  (LO/OVRAL ) 0.3-30 MG-MCG tablet, Take 1 tablet by mouth daily., Disp: 90 tablet, Rfl: 3   Naltrexone -buPROPion  HCl ER 8-90 MG TB12, Take 2 tablet every morning and every evening, Disp: 120 tablet, Rfl: 1   No Known Allergies   Review of Systems  Constitutional: Negative.   Respiratory: Negative.    Cardiovascular: Negative.   Gastrointestinal: Negative.   Neurological: Negative.   Psychiatric/Behavioral: Negative.       Today's Vitals   12/09/23 1603  BP: 110/70  Pulse: 76  Temp: 98.5 F (36.9 C)  TempSrc: Oral  Weight: 202 lb (91.6 kg)  Height: 5' 2 (1.575 m)  PainSc: 0-No pain   Body mass index is 36.95  kg/m.  Wt Readings from Last 3 Encounters:  12/09/23 202 lb (91.6 kg)  10/05/23 204 lb (92.5 kg)  05/11/22 193 lb (87.5 kg)    Objective:  Physical Exam Vitals and nursing note reviewed.  Constitutional:      General: She is not in acute distress.    Appearance: Normal appearance. She is obese.  Cardiovascular:     Pulses: Normal pulses.     Heart sounds: Normal heart sounds. No murmur heard. Pulmonary:     Effort: Pulmonary effort is normal. No respiratory distress.     Breath sounds: No wheezing.  Skin:    General: Skin is  warm and dry.     Capillary Refill: Capillary refill takes less than 2 seconds.  Neurological:     General: No focal deficit present.     Mental Status: She is alert and oriented to person, place, and time.     Cranial Nerves: No cranial nerve deficit.     Motor: No weakness.       Assessment And Plan:  Class 2 obesity due to excess calories with body mass index (BMI) of 36.0 to 36.9 in adult, unspecified whether serious comorbidity present  Class 2 obesity due to excess calories without serious comorbidity with body mass index (BMI) of 35.0 to 35.9 in adult Assessment & Plan: Obesity with slight weight reduction and increased waist circumference. Engaging in regular physical activity and dietary modifications. - Continue Naltrexone -bupropion  two tablets twice a day. - Encourage continuation of physical activity, including walking and gym workouts. - Advise maintaining dietary modifications with reduced sweets and increased water intake. - Reassess weight and waist circumference at the next visit.  Orders: -     Naltrexone -buPROPion  HCl ER; Take 2 tablet every morning and every evening  Dispense: 120 tablet; Refill: 1      Return for keep same next.  Patient was given opportunity to ask questions. Patient verbalized understanding of the plan and was able to repeat key elements of the plan. All questions were answered to their satisfaction.   LILLETTE [name], FNP, have reviewed all documentation for this visit. The documentation on 12/09/23 for the exam, diagnosis, procedures, and orders are all accurate and complete.    IF YOU HAVE BEEN REFERRED TO A SPECIALIST, IT MAY TAKE 1-2 WEEKS TO SCHEDULE/PROCESS THE REFERRAL. IF YOU HAVE NOT HEARD FROM US /SPECIALIST IN TWO WEEKS, PLEASE GIVE US  A CALL AT [phone].

## 2023-12-19 NOTE — Assessment & Plan Note (Signed)
 Obesity with slight weight reduction and increased waist circumference. Engaging in regular physical activity and dietary modifications. - Continue Naltrexone -bupropion  two tablets twice a day. - Encourage continuation of physical activity, including walking and gym workouts. - Advise maintaining dietary modifications with reduced sweets and increased water intake. - Reassess weight and waist circumference at the next visit.

## 2024-01-16 ENCOUNTER — Encounter: Payer: Self-pay | Admitting: Nurse Practitioner

## 2024-01-31 ENCOUNTER — Ambulatory Visit: Payer: Self-pay | Admitting: Nurse Practitioner

## 2024-01-31 ENCOUNTER — Encounter: Payer: Self-pay | Admitting: Nurse Practitioner

## 2024-01-31 VITALS — BP 120/74 | HR 85 | Temp 97.9°F | Ht 62.0 in | Wt 201.6 lb

## 2024-01-31 DIAGNOSIS — Z6836 Body mass index (BMI) 36.0-36.9, adult: Secondary | ICD-10-CM | POA: Diagnosis not present

## 2024-01-31 DIAGNOSIS — E6609 Other obesity due to excess calories: Secondary | ICD-10-CM | POA: Diagnosis not present

## 2024-01-31 DIAGNOSIS — Z1231 Encounter for screening mammogram for malignant neoplasm of breast: Secondary | ICD-10-CM | POA: Diagnosis not present

## 2024-01-31 DIAGNOSIS — E66812 Obesity, class 2: Secondary | ICD-10-CM

## 2024-01-31 MED ORDER — NALTREXONE-BUPROPION HCL ER 8-90 MG PO TB12: ORAL_TABLET | ORAL | 1 refills | Status: DC

## 2024-01-31 NOTE — Progress Notes (Signed)
 [name], CMA,acting as a Neurosurgeon for [name], FNP.,have documented all relevant documentation on the behalf of [name], FNP,as directed by  [name], FNP while in the presence of [name], FNP.  Subjective:  Patient ID: Elizabeth Graham , female    DOB: 1980-09-08 , 43 y.o.   MRN: [id number]  Chief Complaint  Patient presents with   Obesity    Patient presents today for a weight follow up, Patient reports compliance with medication. Patient denies any chest pain, SOB, or headaches. Patient has no concerns today.     HPI  Discussed the use of AI scribe software for clinical note transcription with the patient, who gave verbal consent to proceed.  History of Present Illness Elizabeth Graham is a 43 year old female who presents for a weight check while on Contrave .  She is taking Contrave , two tablets every morning and evening, without missing doses. She exercises five days a week after work, which she identifies as her optimal workout time. She notes a reduction in body fat, as evidenced by losing inches and fitting into a size twelve jeans, despite not seeing a significant change in weight.  She has not eliminated any specific foods but has been consuming more sweets over the past month. She does not eat much bread or similar foods.  She inquired about insurance coverage for weight loss medications with her new provider, [employer].  She has not had a mammogram recently and does not recall the last time she had one. She plans to have her mammogram and Pap smear during her physical examination scheduled for February. She is unsure if she had a previous mammogram at a different location, as there is no record of it in her current medical file.  She works at [employer] and her work schedule has recently changed from 7:00 AM to 3:00 PM to 8:00 AM to 5:00 PM, which she finds challenging but manageable due to the financial benefits.   Past Medical History:  Diagnosis Date    Obesity    Pituitary microadenoma with hyperprolactinemia (HCC) 2017   Polygalactia      Family History  Problem Relation Age of Onset   Diabetes Mother    Hypertension Mother    Heart disease Mother      Current Outpatient Medications:    cetirizine  (ZYRTEC ) 10 MG tablet, TAKE 1 TABLET(10 MG) BY MOUTH DAILY, Disp: 90 tablet, Rfl: 1   fluticasone (FLONASE) 50 MCG/ACT nasal spray, Place 2 sprays into both nostrils daily., Disp: , Rfl:    ibuprofen  (ADVIL ,MOTRIN ) 800 MG tablet, Take 1 tablet (800 mg total) by mouth every 8 (eight) hours as needed for headache., Disp: 90 tablet, Rfl: 0   norgestrel -ethinyl estradiol  (LO/OVRAL ) 0.3-30 MG-MCG tablet, Take 1 tablet by mouth daily., Disp: 90 tablet, Rfl: 3   Naltrexone -buPROPion  HCl ER 8-90 MG TB12, Take 2 tablet every morning and every evening, Disp: 120 tablet, Rfl: 1   No Known Allergies   Review of Systems  Constitutional: Negative.   Respiratory: Negative.    Cardiovascular: Negative.   Skin: Negative.   Neurological: Negative.   Psychiatric/Behavioral: Negative.       Today's Vitals   01/31/24 1421  BP: 120/74  Pulse: 85  Temp: 97.9 F (36.6 C)  TempSrc: Oral  Weight: 201 lb 9.6 oz (91.4 kg)  Height: 5' 2 (1.575 m)  PainSc: 0-No pain   Body mass index is 36.87 kg/m.  Wt Readings from Last 3 Encounters:  01/31/24 201 lb 9.6 oz (91.4 kg)  12/09/23 202 lb (91.6 kg)  10/05/23 204 lb (92.5 kg)      Objective:  Physical Exam Vitals and nursing note reviewed.  Constitutional:      General: She is not in acute distress.    Appearance: Normal appearance. She is obese.  Cardiovascular:     Rate and Rhythm: Normal rate and regular rhythm.     Pulses: Normal pulses.     Heart sounds: Normal heart sounds. No murmur heard. Pulmonary:     Effort: Pulmonary effort is normal. No respiratory distress.     Breath sounds: No wheezing.  Skin:    General: Skin is warm and dry.     Capillary Refill: Capillary refill takes  less than 2 seconds.  Neurological:     General: No focal deficit present.     Mental Status: She is alert and oriented to person, place, and time.     Cranial Nerves: No cranial nerve deficit.     Motor: No weakness.     n Assessment And Plan:  Class 2 obesity due to excess calories with body mass index (BMI) of 36.0 to 36.9 in adult, unspecified whether serious comorbidity present Assessment & Plan: On Contrave  for weight management, exercises regularly, reports fat loss. Needs dietary adjustments. - Continue Contrave  two tablets every morning and evening. - Encourage exercise five days a week. - Advise reducing sweets, maintain healthy diet. - Check insurance coverage for weight loss medications.    Orders: -     Naltrexone -buPROPion  HCl ER; Take 2 tablet every morning and every evening  Dispense: 120 tablet; Refill: 1 -     3D Screening Mammogram, Left and Right; Future  Encounter for screening mammogram for breast cancer Assessment & Plan: Mammogram overdue, plans for mammogram and Pap smear in February. Insurance fiscal year noted. - Order screening mammogram, coordinate with breast center. - Ensure mammogram completion this year.     Return for 2 months weight check.  Patient was given opportunity to ask questions. Patient verbalized understanding of the plan and was able to repeat key elements of the plan. All questions were answered to their satisfaction.    LILLETTE [name], FNP, have reviewed all documentation for this visit. The documentation on 01/31/24 for the exam, diagnosis, procedures, and orders are all accurate and complete.   IF YOU HAVE BEEN REFERRED TO A SPECIALIST, IT MAY TAKE 1-2 WEEKS TO SCHEDULE/PROCESS THE REFERRAL. IF YOU HAVE NOT HEARD FROM US /SPECIALIST IN TWO WEEKS, PLEASE GIVE US  A CALL AT [phone].

## 2024-02-06 DIAGNOSIS — Z1231 Encounter for screening mammogram for malignant neoplasm of breast: Secondary | ICD-10-CM | POA: Insufficient documentation

## 2024-02-06 NOTE — Assessment & Plan Note (Signed)
 Mammogram overdue, plans for mammogram and Pap smear in February. Insurance fiscal year noted. - Order screening mammogram, coordinate with breast center. - Ensure mammogram completion this year.

## 2024-02-06 NOTE — Assessment & Plan Note (Signed)
 On Contrave  for weight management, exercises regularly, reports fat loss. Needs dietary adjustments. - Continue Contrave  two tablets every morning and evening. - Encourage exercise five days a week. - Advise reducing sweets, maintain healthy diet. - Check insurance coverage for weight loss medications.

## 2024-02-15 ENCOUNTER — Ambulatory Visit

## 2024-02-22 ENCOUNTER — Ambulatory Visit
Admission: RE | Admit: 2024-02-22 | Discharge: 2024-02-22 | Disposition: A | Source: Ambulatory Visit | Attending: Nurse Practitioner | Admitting: Nurse Practitioner

## 2024-02-22 DIAGNOSIS — Z6836 Body mass index (BMI) 36.0-36.9, adult: Secondary | ICD-10-CM

## 2024-02-22 DIAGNOSIS — Z1231 Encounter for screening mammogram for malignant neoplasm of breast: Secondary | ICD-10-CM | POA: Diagnosis not present

## 2024-03-27 ENCOUNTER — Emergency Department (HOSPITAL_COMMUNITY)
Admission: EM | Admit: 2024-03-27 | Discharge: 2024-03-27 | Disposition: A | Discharge status: Home / Self Care | Source: Ambulatory Visit | Attending: Emergency Medicine | Admitting: Emergency Medicine

## 2024-03-27 ENCOUNTER — Other Ambulatory Visit: Payer: Self-pay

## 2024-03-27 ENCOUNTER — Emergency Department (HOSPITAL_COMMUNITY)

## 2024-03-27 DIAGNOSIS — S8990XA Unspecified injury of unspecified lower leg, initial encounter: Secondary | ICD-10-CM

## 2024-03-27 DIAGNOSIS — T148XXA Other injury of unspecified body region, initial encounter: Secondary | ICD-10-CM

## 2024-03-27 MED ORDER — ACETAMINOPHEN 500 MG PO TABS
1000.0000 mg | ORAL_TABLET | Freq: Once | ORAL | Status: AC
  Administered 2024-03-27: 1000 mg via ORAL
  Filled 2024-03-27: qty 2

## 2024-03-27 MED ORDER — NAPROXEN 500 MG PO TABS: 500.0000 mg | ORAL_TABLET | Freq: Two times a day (BID) | ORAL | 0 refills | Status: AC

## 2024-03-27 NOTE — ED Triage Notes (Addendum)
 Scientist, Physiological.   PT ambulatory to triage stating that her LEFT leg fell between a loading dock and a truck. PT reports pain in the thigh and laceration to the thigh. Denies head trauma. Denies blood thinners.

## 2024-03-27 NOTE — ED Notes (Signed)
 I instructed patient to not sit on stool because it might slip. Klj

## 2024-03-27 NOTE — ED Provider Notes (Signed)
 [place] EMERGENCY DEPARTMENT AT [facility] Provider Note   CSN: [id number] Arrival date & time: 03/27/24  1251     Patient presents with: Leg Injury   Elizabeth Graham is a 43 y.o. female.  43 year old female presents ED with complaints of left knee injury following a fall at work.  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 In this area there is a gap that she stepped into without realizing it and her entire left leg went into the gap.  She has a large skin tear on the medial aspect of her left knee and some bruising and swelling on her lateral aspect of her thigh. Patient reports she has been ambulatory since incident but does endorse pain.  Patient did not hit her head and denies any other sustained injuries during the fall.  Patient denies any blood thinners.  Patient denies any significant medical history.     Prior to Admission medications   Medication Sig Start Date End Date Taking? Authorizing Provider  naproxen  (NAPROSYN ) 500 MG tablet Take 1 tablet (500 mg total) by mouth 2 (two) times daily. 03/27/24  Yes Myriam Fonda RAMAN, PA-C  cetirizine  (ZYRTEC ) 10 MG tablet TAKE 1 TABLET(10 MG) BY MOUTH DAILY 07/06/23   Georgina Speaks, FNP  fluticasone (FLONASE) 50 MCG/ACT nasal spray Place 2 sprays into both nostrils daily.    [provider]  ibuprofen  (ADVIL ,MOTRIN ) 800 MG tablet Take 1 tablet (800 mg total) by mouth every 8 (eight) hours as needed for headache. 06/01/18   Rodriguez-Southworth, Sylvia, PA-C  Naltrexone -buPROPion  HCl ER 8-90 MG TB12 Take 2 tablet every morning and every evening 01/31/24   Moore, Janece, FNP  norgestrel -ethinyl estradiol  (LO/OVRAL ) 0.3-30 MG-MCG tablet Take 1 tablet by mouth daily. 10/05/23   Georgina Speaks, FNP    Allergies: Patient has no known allergies.    Review of Systems  Musculoskeletal:  Positive for myalgias.  Skin:  Positive for wound.  All other systems  reviewed and are negative.   Updated Vital Signs BP 110/71   Pulse 77   Temp 98.4 F (36.9 C)   Resp 18   LMP 03/23/2024 (Exact Date)   SpO2 99%   Physical Exam Vitals and nursing note reviewed.  Constitutional:      Appearance: Normal appearance.  HENT:     Head: Normocephalic and atraumatic.     Nose: Nose normal.  Eyes:     Extraocular Movements: Extraocular movements intact.     Conjunctiva/sclera: Conjunctivae normal.     Pupils: Pupils are equal, round, and reactive to light.  Cardiovascular:     Rate and Rhythm: Normal rate.  Pulmonary:     Effort: Pulmonary effort is normal. No respiratory distress.     Breath sounds: Normal breath sounds.     Comments: Lungs are clear to auscultation in all fields Abdominal:     General: Abdomen is flat. There is no distension.     Tenderness: There is no abdominal tenderness. There is no guarding.  Musculoskeletal:        General: Normal range of motion.     Cervical back: Normal range of motion.     Comments: Patient has a skin tear and bruising on the medial aspect of the left knee.  Patient also has some bruising and swelling on the lateral aspect of the left thigh.  Patient has full range of motion of the knee without any laxity  throughout the joint.  Full range of motion of the distal extremity and strong pulses, good cap refill and sensation.  No pain to palpation with range of motion or palpation of the left hip.  Skin:    General: Skin is warm.     Capillary Refill: Capillary refill takes less than 2 seconds.  Neurological:     General: No focal deficit present.     Mental Status: She is alert.  Psychiatric:        Mood and Affect: Mood normal.        Behavior: Behavior normal.     (all labs ordered are listed, but only abnormal results are displayed) Labs Reviewed - No data to display  EKG: None  Radiology: DG Knee Complete 4 Views Left Result Date: 03/27/2024 CLINICAL DATA:  Left knee pain following a  fall. EXAM: LEFT KNEE - COMPLETE 4+ VIEW COMPARISON:  None Available. FINDINGS: No evidence of fracture, dislocation, or joint effusion. No evidence of arthropathy or other focal bone abnormality. Soft tissues are unremarkable. IMPRESSION: Negative. Electronically Signed   By: Elspeth Bathe M.D.   On: 03/27/2024 14:46     Procedures   Medications Ordered in the ED  acetaminophen  (TYLENOL ) tablet 1,000 mg (1,000 mg Oral Given 03/27/24 1352)    43 y.o. female presents to the ED with complaints of left knee pain, The differential diagnosis includes musculoskeletal injury, fracture, dislocation, tendon/ligamentous injury (Ddx)  On arrival pt is nontoxic, vitals unremarkable. Exam significant for skin tear noted to the left medial knee and bruising and swelling noted to the left lateral thigh.  Full range of motion throughout entire extremity and patient is ambulatory.  Patient denies any other notable pain throughout other extremities.  I ordered medication Tylenol  for her pain.  Imaging Studies ordered:  I ordered imaging studies which included left knee x-ray, which was negative for any acute abnormality.  ED Course:   Patient sitting comfortably in ED bed in no acute distress.  Patient is ambulatory and able to fully flex and extend her knee without difficulty.  Patient reporting pain in her thigh and knee.  The knee does not appear to be dislocated.  The wound on the medial portion of the leg would not benefit from sutures it is a superficial skin tear.  It was advised to patient we would get x-rays and reassess.  The wound was cleaned and rebandaged.  X-rays are negative and patient is able to ambulate to the bathroom without assistance during ED stay.  Wound care and follow-up with orthopedics was discussed with patient at bedside and she agreed with treatment plan.  Patient asked about Worker's Compensation claim.  She reported she was sent to a [facility] facility that sent her here.  It was  advised to follow-up with the original [facility] facility that was recommended by her workplace for further [employer] instructions.  Patient agreed with follow-up and was comfortable with discharge at this time.  Portions of this note were generated with Scientist, clinical (histocompatibility and immunogenetics). Dictation errors may occur despite best attempts at proofreading.   Final diagnoses:  Knee injury, initial encounter  Skin abrasion    ED Discharge Orders          Ordered    naproxen  (NAPROSYN ) 500 MG tablet  2 times daily        03/27/24 1524               Myriam Fonda RAMAN, [place] 03/27/24 2358  Patsey Lot, MD 03/28/24 [phone]

## 2024-03-27 NOTE — Discharge Instructions (Addendum)
 Imaging was reassuring today.  Please use ibuprofen  and Tylenol  as needed to manage pain.  I have provided you with an orthopedic to contact if pain is not resolving in approximately a week.  Please clean the wound daily with warm soap and water and use bacitracin and bandages to keep wound covered.  I have also provided you with a naproxen  prescription it is a NSAID that you can use instead of ibuprofen .  If any new concerning symptoms arise or symptoms worsen please return to ED for further evaluation.

## 2024-03-29 ENCOUNTER — Ambulatory Visit: Payer: Self-pay

## 2024-03-29 NOTE — Telephone Encounter (Signed)
 FYI Only or Action Required?: FYI only for provider: rescheduled 12/24 appt to 05/09/24 per patient request.  Patient was last seen in primary care on 01/31/2024 by Georgina Speaks, FNP.  Called Nurse Triage reporting Knee Pain. Symptoms began n/a.  Interventions attempted: Other: n/a.  Symptoms are: n/a.  Triage Disposition: Information or Advice Only Call  Patient/caregiver understands and will follow disposition?: Yes Reason for Disposition  General information question, no triage required and triager able to answer question  Answer Assessment - Initial Assessment Questions 1. REASON FOR CALL: What is the main reason for your call? or How can I best help you?     Patient was calling in because she received a message about rescheduling her 12/24 appointment. Patient requested to cancel that appointment for her weight, and reschedule for sometime in January. Patient stated she is supposed to go back to work tomorrow and will call to schedule another appointment a different time for her knee.   2. SYMPTOMS : Do you have any symptoms?      Knee pain  Protocols used: Information Only Call - No Triage-A-AH  Copied from CRM [id number]. Topic: Clinical - Red Word Triage >> Mar 29, 2024  2:24 PM Elizabeth Graham wrote: Red Word that prompted transfer to Nurse Triage: Patient had a fall at work and injured her left knee, and is still having pretty bad pain still in her knee

## 2024-04-10 ENCOUNTER — Ambulatory Visit (HOSPITAL_COMMUNITY)
Admission: RE | Admit: 2024-04-10 | Discharge: 2024-04-10 | Disposition: A | Discharge status: Home / Self Care | Source: Ambulatory Visit | Attending: Nurse Practitioner | Admitting: Nurse Practitioner

## 2024-04-10 ENCOUNTER — Other Ambulatory Visit (HOSPITAL_COMMUNITY): Payer: Self-pay | Admitting: Nurse Practitioner

## 2024-04-10 DIAGNOSIS — Z026 Encounter for examination for insurance purposes: Secondary | ICD-10-CM | POA: Diagnosis present

## 2024-04-10 DIAGNOSIS — S9002XA Contusion of left ankle, initial encounter: Secondary | ICD-10-CM

## 2024-04-12 ENCOUNTER — Ambulatory Visit: Payer: Self-pay | Admitting: Nurse Practitioner

## 2024-04-23 ENCOUNTER — Other Ambulatory Visit: Payer: Self-pay | Admitting: Nurse Practitioner

## 2024-04-26 ENCOUNTER — Ambulatory Visit

## 2024-05-08 NOTE — Progress Notes (Signed)
 [name], CMA,acting as a neurosurgeon for Elizabeth Ada, FNP.,have documented all relevant documentation on the behalf of Elizabeth Ada, FNP,as directed by  Elizabeth Ada, FNP while in the presence of Elizabeth Ada, FNP.  Subjective:  Patient ID: Elizabeth Graham , female    DOB: 05/18/1980 , 44 y.o.   MRN: [id number]  Chief Complaint  Patient presents with   Obesity    Patient presents today for a weight follow up, Patient reports compliance with medication. Patient denies any chest pain, SOB, or headaches. Patient reports she fell at work and has been on light duty, she hasn't been able exercise since December.      Contrave  two tablets twice a day, not able to exercise due to a work injury (pain to left leg). She is having swelling to her left leg.     Discussed the use of AI scribe software for clinical note transcription with the patient, who gave verbal consent to proceed.  History of Present Illness [name] is a 44 year old female who presents for a weight check and follow-up on a leg injury.  She sustained a leg injury on December 8th involving her entire leg, with an initial cut that has healed. However, she continues to experience swelling, which has improved but persists to the extent that she cannot wear a sock. The injury occurred due to a fall at work, and there is concern about scar tissue formation.  She is currently taking naproxen  and ibuprofen  for pain management. Initially, she was prescribed hydrocodone but did not take it concurrently with her Contrave  due to potential interactions. She was off Contrave  for about five days following the injury. She continues to take Contrave , two tablets twice a day, without issues.  Her last menstrual cycle was on the 25th, and she requests a refill for her birth control prescription. She receives her birth control from [facility] and her Contrave  through [facility].  No issues with sleep or problems related to her current medications. Her  weight has remained stable despite the lack of exercise.  Past Medical History:  Diagnosis Date   Obesity    Pituitary microadenoma with hyperprolactinemia (HCC) 2017   Polygalactia      Family History  Problem Relation Age of Onset   Diabetes Mother    Hypertension Mother    Heart disease Mother     Current Medications[1]   Allergies[2]   Review of Systems  Constitutional: Negative.   Respiratory: Negative.    Cardiovascular: Negative.   Skin: Negative.   Neurological: Negative.   Psychiatric/Behavioral: Negative.       Today's Vitals   05/09/24 1606  BP: 116/70  Pulse: 87  Temp: 97.8 F (36.6 C)  TempSrc: Oral  Weight: 204 lb 9.6 oz (92.8 kg)  Height: 5' 2 (1.575 m)  PainSc: 7   PainLoc: Leg   Body mass index is 37.42 kg/m.  Wt Readings from Last 3 Encounters:  05/09/24 204 lb 9.6 oz (92.8 kg)  01/31/24 201 lb 9.6 oz (91.4 kg)  12/09/23 202 lb (91.6 kg)    Patient's last menstrual period was 04/13/2024.   Objective:  Physical Exam Vitals and nursing note reviewed.  Constitutional:      General: She is not in acute distress.    Appearance: Normal appearance. She is obese.  Cardiovascular:     Rate and Rhythm: Normal rate and regular rhythm.     Pulses: Normal pulses.     Heart sounds: Normal heart sounds.  No murmur heard. Pulmonary:     Effort: Pulmonary effort is normal. No respiratory distress.     Breath sounds: No wheezing.  Skin:    General: Skin is warm and dry.     Capillary Refill: Capillary refill takes less than 2 seconds.  Neurological:     General: No focal deficit present.     Mental Status: She is alert and oriented to person, place, and time.     Cranial Nerves: No cranial nerve deficit.     Motor: No weakness.        Assessment And Plan:   Assessment & Plan Class 2 obesity due to excess calories with body mass index (BMI) of 37.0 to 37.9 in adult, unspecified whether serious comorbidity present Weight stable with  naltrexone -bupropion  despite reduced activity. No side effects from medication. Resumed Contrave  after hydrocodone discontinuation. - Continue naltrexone -bupropion  (Contrave ) as prescribed. - Advise gradual exercise resumption post-clearance. - Refilled Contrave  prescription through [facility]. Encounter for surveillance of contraceptive pills No issues with norgestrel -ethinyl estradiol  regimen. - Continue norgestrel -ethinyl estradiol  as prescribed. - Refilled birth control prescription at [facility].  No orders of the defined types were placed in this encounter.    Return for KEEP SAME NEXT.  Patient was given opportunity to ask questions. Patient verbalized understanding of the plan and was able to repeat key elements of the plan. All questions were answered to their satisfaction.   LILLETTE Elizabeth Ada, FNP, have reviewed all documentation for this visit. The documentation on 05/09/24 for the exam, diagnosis, procedures, and orders are all accurate and complete.   IF YOU HAVE BEEN REFERRED TO A SPECIALIST, IT MAY TAKE 1-2 WEEKS TO SCHEDULE/PROCESS THE REFERRAL. IF YOU HAVE NOT HEARD FROM US /SPECIALIST IN TWO WEEKS, PLEASE GIVE US  A CALL AT [phone].      [1]  Current Outpatient Medications:    cetirizine  (ZYRTEC ) 10 MG tablet, TAKE 1 TABLET(10 MG) BY MOUTH DAILY, Disp: 90 tablet, Rfl: 1   fluticasone (FLONASE) 50 MCG/ACT nasal spray, Place 2 sprays into both nostrils daily., Disp: , Rfl:    ibuprofen  (ADVIL ,MOTRIN ) 800 MG tablet, Take 1 tablet (800 mg total) by mouth every 8 (eight) hours as needed for headache., Disp: 90 tablet, Rfl: 0   naproxen  (NAPROSYN ) 500 MG tablet, Take 1 tablet (500 mg total) by mouth 2 (two) times daily., Disp: 30 tablet, Rfl: 0   Naltrexone -buPROPion  HCl ER 8-90 MG TB12, Take 2 tablet every morning and every evening, Disp: 120 tablet, Rfl: 1   norgestrel -ethinyl estradiol  (LO/OVRAL ) 0.3-30 MG-MCG tablet, Take 1 tablet by mouth daily., Disp: 90 tablet, Rfl:  3 [2] No Known Allergies

## 2024-05-09 ENCOUNTER — Ambulatory Visit: Payer: Self-pay | Admitting: Nurse Practitioner

## 2024-05-09 VITALS — BP 116/70 | HR 87 | Temp 97.8°F | Ht 62.0 in | Wt 204.6 lb

## 2024-05-09 DIAGNOSIS — E66812 Obesity, class 2: Secondary | ICD-10-CM | POA: Diagnosis not present

## 2024-05-09 DIAGNOSIS — Z3041 Encounter for surveillance of contraceptive pills: Secondary | ICD-10-CM

## 2024-05-09 DIAGNOSIS — E6609 Other obesity due to excess calories: Secondary | ICD-10-CM

## 2024-05-09 DIAGNOSIS — Z6837 Body mass index (BMI) 37.0-37.9, adult: Secondary | ICD-10-CM

## 2024-05-09 MED ORDER — NALTREXONE-BUPROPION HCL ER 8-90 MG PO TB12: ORAL_TABLET | ORAL | 1 refills | Status: AC

## 2024-05-09 MED ORDER — NORGESTREL-ETHINYL ESTRADIOL 0.3-30 MG-MCG PO TABS: 1.0000 | ORAL_TABLET | Freq: Every day | ORAL | 3 refills | Status: AC

## 2024-05-16 NOTE — Assessment & Plan Note (Signed)
 Weight stable with naltrexone -bupropion  despite reduced activity. No side effects from medication. Resumed Contrave  after hydrocodone discontinuation. - Continue naltrexone -bupropion  (Contrave ) as prescribed. - Advise gradual exercise resumption post-clearance. - Refilled Contrave  prescription through [facility].

## 2024-05-16 NOTE — Assessment & Plan Note (Signed)
 No issues with norgestrel -ethinyl estradiol  regimen. - Continue norgestrel -ethinyl estradiol  as prescribed. - Refilled birth control prescription at [facility].

## 2024-05-31 ENCOUNTER — Encounter: Payer: Self-pay | Admitting: Nurse Practitioner
